# Patient Record
Sex: Female | Born: 1965 | Race: White | Hispanic: No | Marital: Married | Smoking: Never smoker
Health system: Southern US, Community
[De-identification: ages and names within clinical notes are randomized; demographics above are authoritative.]

## PROBLEM LIST (undated history)

## (undated) DIAGNOSIS — K8020 Calculus of gallbladder without cholecystitis without obstruction: Secondary | ICD-10-CM

## (undated) DIAGNOSIS — E66811 Obesity, class 1: Secondary | ICD-10-CM

## (undated) DIAGNOSIS — R010 Benign and innocent cardiac murmurs: Secondary | ICD-10-CM

## (undated) DIAGNOSIS — M722 Plantar fascial fibromatosis: Secondary | ICD-10-CM

## (undated) DIAGNOSIS — E559 Vitamin D deficiency, unspecified: Secondary | ICD-10-CM

## (undated) DIAGNOSIS — Z9109 Other allergy status, other than to drugs and biological substances: Secondary | ICD-10-CM

## (undated) DIAGNOSIS — E782 Mixed hyperlipidemia: Secondary | ICD-10-CM

## (undated) DIAGNOSIS — K219 Gastro-esophageal reflux disease without esophagitis: Secondary | ICD-10-CM

## (undated) HISTORY — DX: Gastro-esophageal reflux disease without esophagitis: K21.9

## (undated) HISTORY — DX: Other allergy status, other than to drugs and biological substances: Z91.09

---

## 1998-07-14 ENCOUNTER — Other Ambulatory Visit: Admission: RE | Admit: 1998-07-14 | Discharge: 1998-07-14 | Payer: Self-pay | Admitting: Obstetrics and Gynecology

## 1999-01-26 ENCOUNTER — Inpatient Hospital Stay (HOSPITAL_COMMUNITY)
Admission: AD | Admit: 1999-01-26 | Discharge: 1999-01-31 | Payer: Self-pay | Admitting: Physical Medicine & Rehabilitation

## 1999-03-02 ENCOUNTER — Other Ambulatory Visit: Admission: RE | Admit: 1999-03-02 | Discharge: 1999-03-02 | Payer: Self-pay | Admitting: Obstetrics and Gynecology

## 1999-10-14 ENCOUNTER — Other Ambulatory Visit: Admission: RE | Admit: 1999-10-14 | Discharge: 1999-10-14 | Payer: Self-pay | Admitting: Obstetrics and Gynecology

## 2000-05-09 ENCOUNTER — Inpatient Hospital Stay (HOSPITAL_COMMUNITY): Admission: AD | Admit: 2000-05-09 | Discharge: 2000-05-09 | Payer: Self-pay | Admitting: Obstetrics and Gynecology

## 2000-05-16 ENCOUNTER — Inpatient Hospital Stay (HOSPITAL_COMMUNITY): Admission: AD | Admit: 2000-05-16 | Discharge: 2000-05-18 | Payer: Self-pay | Admitting: Obstetrics and Gynecology

## 2000-06-27 ENCOUNTER — Other Ambulatory Visit: Admission: RE | Admit: 2000-06-27 | Discharge: 2000-06-27 | Payer: Self-pay | Admitting: Obstetrics and Gynecology

## 2001-08-29 ENCOUNTER — Other Ambulatory Visit: Admission: RE | Admit: 2001-08-29 | Discharge: 2001-08-29 | Payer: Self-pay | Admitting: Obstetrics and Gynecology

## 2002-09-24 ENCOUNTER — Other Ambulatory Visit: Admission: RE | Admit: 2002-09-24 | Discharge: 2002-09-24 | Payer: Self-pay | Admitting: Obstetrics and Gynecology

## 2003-12-16 ENCOUNTER — Other Ambulatory Visit: Admission: RE | Admit: 2003-12-16 | Discharge: 2003-12-16 | Payer: Self-pay | Admitting: Obstetrics and Gynecology

## 2005-03-03 ENCOUNTER — Other Ambulatory Visit: Admission: RE | Admit: 2005-03-03 | Discharge: 2005-03-03 | Payer: Self-pay | Admitting: Obstetrics and Gynecology

## 2005-03-07 ENCOUNTER — Emergency Department: Payer: Self-pay | Admitting: Internal Medicine

## 2006-04-22 ENCOUNTER — Ambulatory Visit: Payer: Self-pay | Admitting: Internal Medicine

## 2007-12-06 ENCOUNTER — Ambulatory Visit: Payer: Self-pay | Admitting: Internal Medicine

## 2013-10-02 ENCOUNTER — Ambulatory Visit: Payer: Self-pay

## 2013-10-12 ENCOUNTER — Ambulatory Visit: Payer: Self-pay | Admitting: Internal Medicine

## 2015-05-26 DIAGNOSIS — K080 Exfoliation of teeth due to systemic causes: Secondary | ICD-10-CM | POA: Diagnosis not present

## 2015-05-27 DIAGNOSIS — K080 Exfoliation of teeth due to systemic causes: Secondary | ICD-10-CM | POA: Diagnosis not present

## 2015-05-30 DIAGNOSIS — E559 Vitamin D deficiency, unspecified: Secondary | ICD-10-CM | POA: Diagnosis not present

## 2015-05-30 DIAGNOSIS — E781 Pure hyperglyceridemia: Secondary | ICD-10-CM | POA: Diagnosis not present

## 2015-05-30 DIAGNOSIS — E782 Mixed hyperlipidemia: Secondary | ICD-10-CM | POA: Diagnosis not present

## 2015-05-30 DIAGNOSIS — L309 Dermatitis, unspecified: Secondary | ICD-10-CM | POA: Diagnosis not present

## 2015-05-30 DIAGNOSIS — M7071 Other bursitis of hip, right hip: Secondary | ICD-10-CM | POA: Diagnosis not present

## 2015-05-30 DIAGNOSIS — Z0001 Encounter for general adult medical examination with abnormal findings: Secondary | ICD-10-CM | POA: Diagnosis not present

## 2015-07-11 DIAGNOSIS — K080 Exfoliation of teeth due to systemic causes: Secondary | ICD-10-CM | POA: Diagnosis not present

## 2015-08-08 DIAGNOSIS — Z1231 Encounter for screening mammogram for malignant neoplasm of breast: Secondary | ICD-10-CM | POA: Diagnosis not present

## 2015-08-08 DIAGNOSIS — Z01419 Encounter for gynecological examination (general) (routine) without abnormal findings: Secondary | ICD-10-CM | POA: Diagnosis not present

## 2015-08-08 DIAGNOSIS — Z6833 Body mass index (BMI) 33.0-33.9, adult: Secondary | ICD-10-CM | POA: Diagnosis not present

## 2015-09-01 DIAGNOSIS — K080 Exfoliation of teeth due to systemic causes: Secondary | ICD-10-CM | POA: Diagnosis not present

## 2015-11-27 DIAGNOSIS — K080 Exfoliation of teeth due to systemic causes: Secondary | ICD-10-CM | POA: Diagnosis not present

## 2015-12-10 DIAGNOSIS — K080 Exfoliation of teeth due to systemic causes: Secondary | ICD-10-CM | POA: Diagnosis not present

## 2015-12-18 DIAGNOSIS — K080 Exfoliation of teeth due to systemic causes: Secondary | ICD-10-CM | POA: Diagnosis not present

## 2016-04-01 DIAGNOSIS — J3089 Other allergic rhinitis: Secondary | ICD-10-CM | POA: Diagnosis not present

## 2016-04-01 DIAGNOSIS — K219 Gastro-esophageal reflux disease without esophagitis: Secondary | ICD-10-CM | POA: Diagnosis not present

## 2016-04-26 IMAGING — MR MRI OF THE RIGHT HIP WITHOUT CONTRAST
4 of 5 series · 23 of 40 positions shown · non-contrast
Comparison: 10/02/2013

CLINICAL DATA: Right hip pain since June 2013. This started after a
long hike.

EXAM:
MR OF THE RIGHT HIP WITHOUT CONTRAST
TECHNIQUE: Multiplanar, multisequence MR imaging was performed. No intravenous
contrast was administered.

[Series 2: T1 · axial · 5.0mm · 0.78mm/px · z∈[-71,+132]mm · 5 of 40 slices shown (1 of 2)]
[im 1/40]
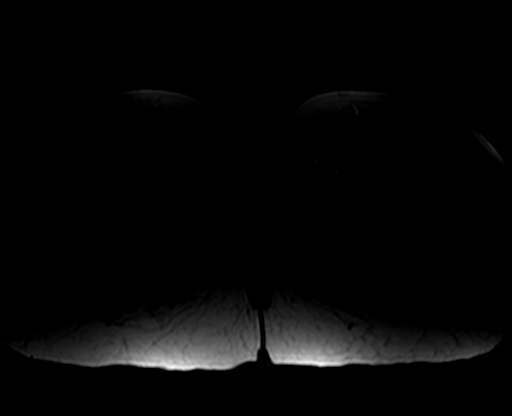
[im 5/40]
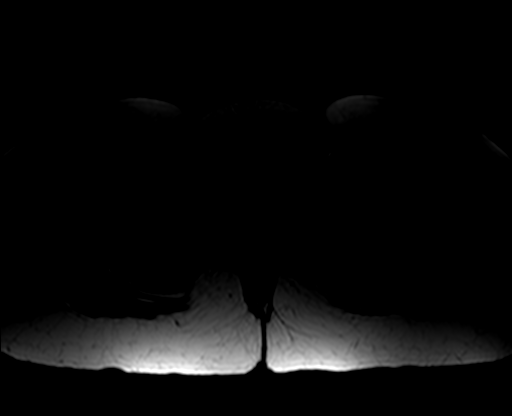
[im 10/40]
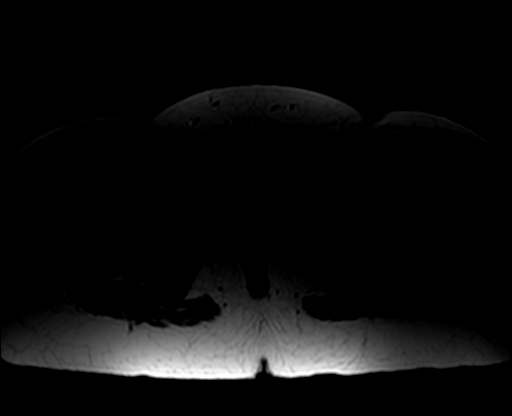
[im 20/40]
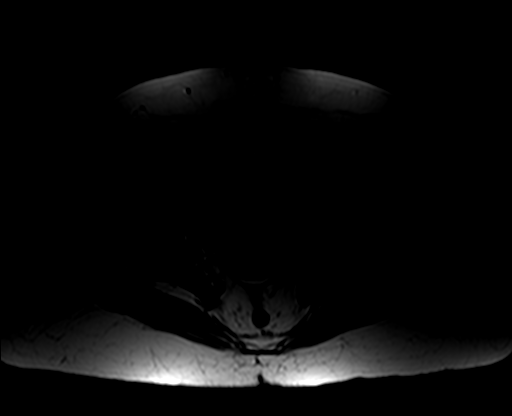
[im 35/40]
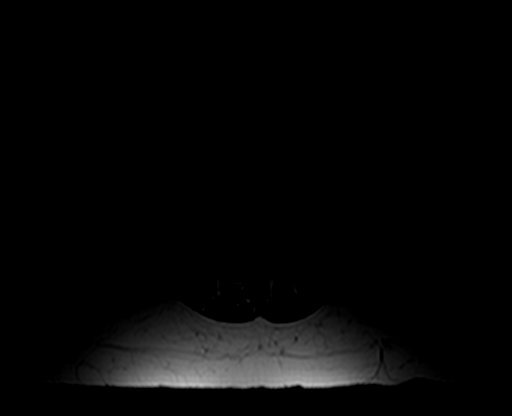

[Series 3: T2 fat-sat · axial · 5.0mm · 0.78mm/px · z∈[-73,+161]mm · 9 of 40 slices shown]
[im 1/40]
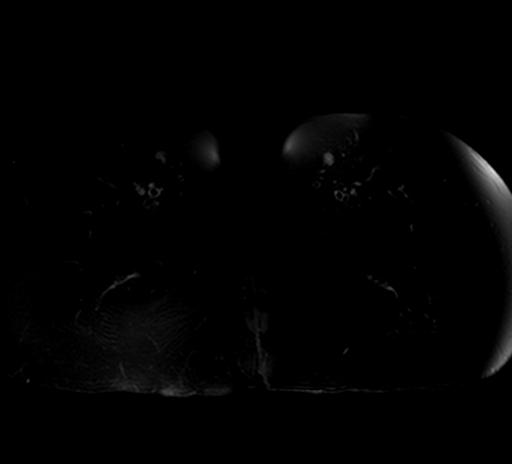
[im 5/40]
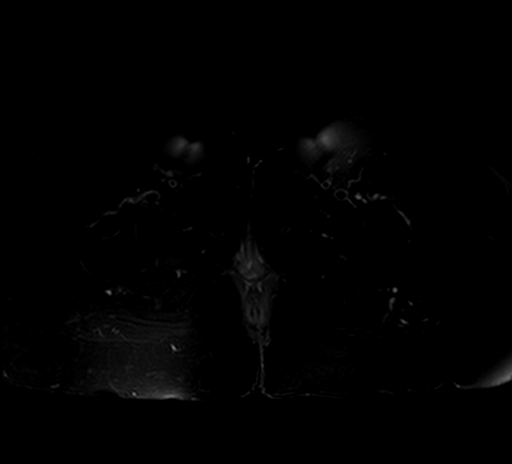
[im 10/40]
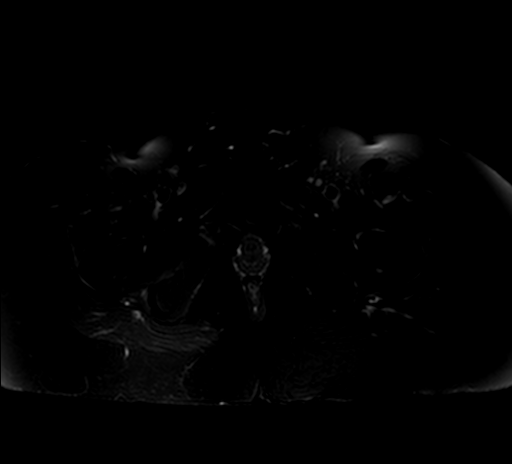
[im 15/40]
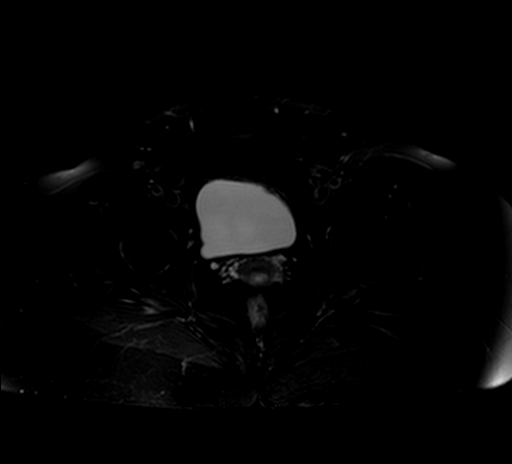
[im 20/40]
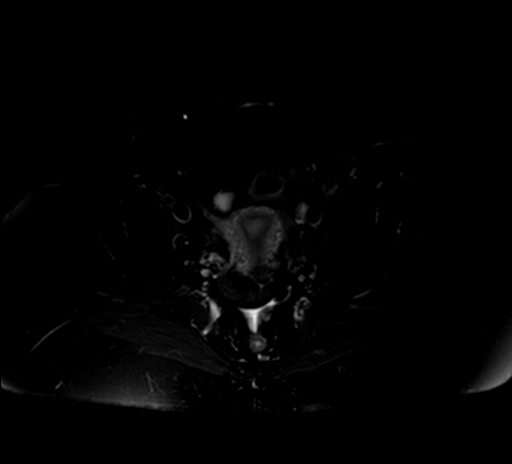
[im 25/40]
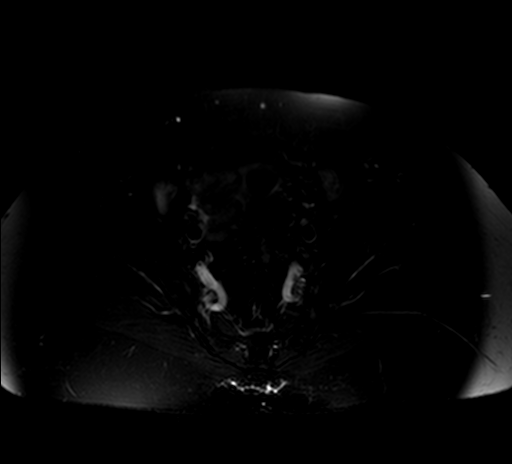
[im 30/40]
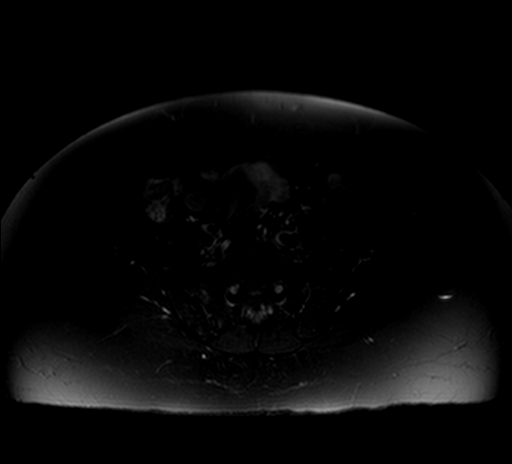
[im 35/40]
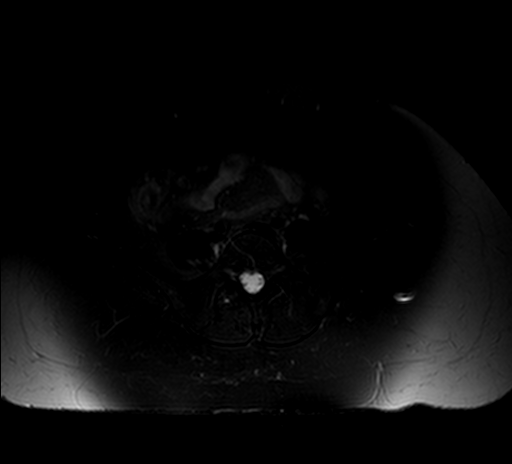
[im 40/40]
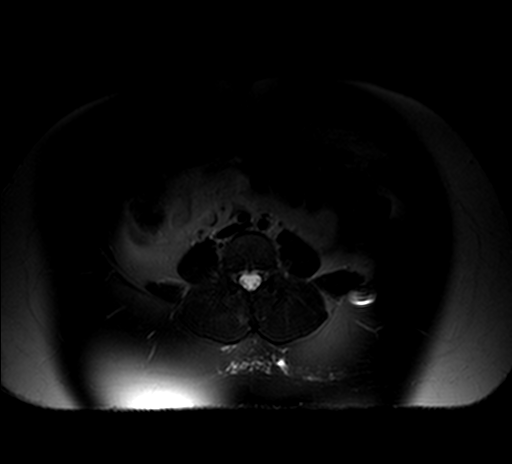

[Series 6: T1 · coronal · 4.0mm · 0.72mm/px · 3 of 35 slices shown (2 of 2)]
[im 5/35]
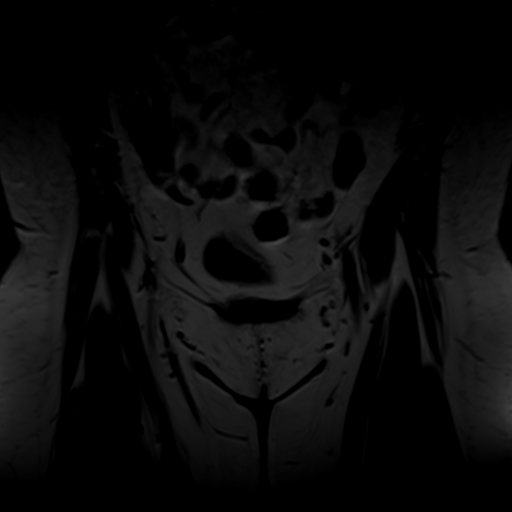
[im 20/35]
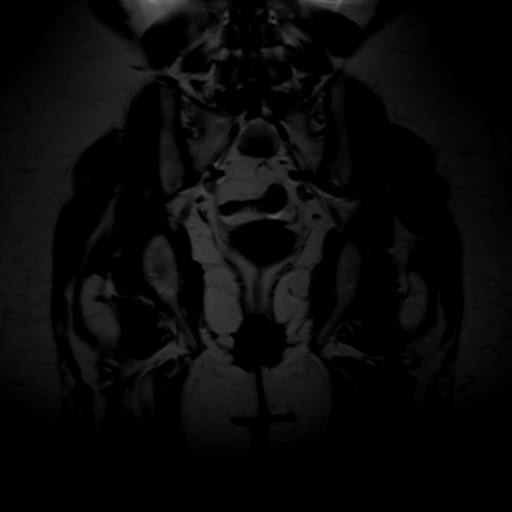
[im 30/35]
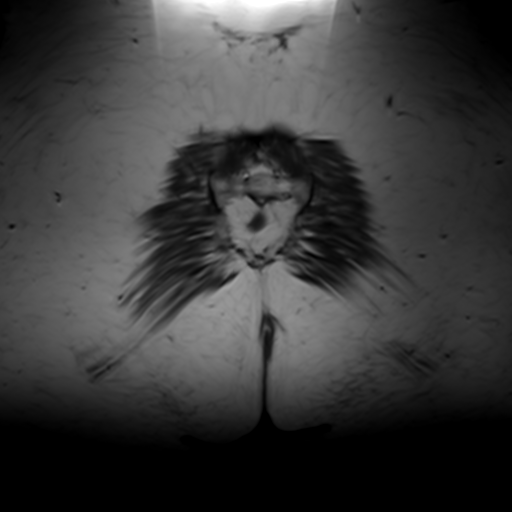

[Series 9: PD fat-sat · sagittal · 5.0mm · 0.29mm/px · 6 of 25 slices shown]
[im 1/25]
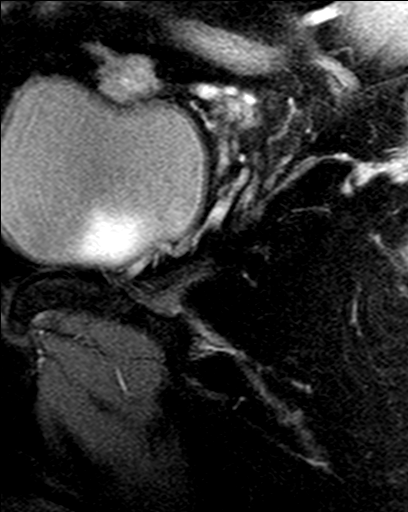
[im 5/25]
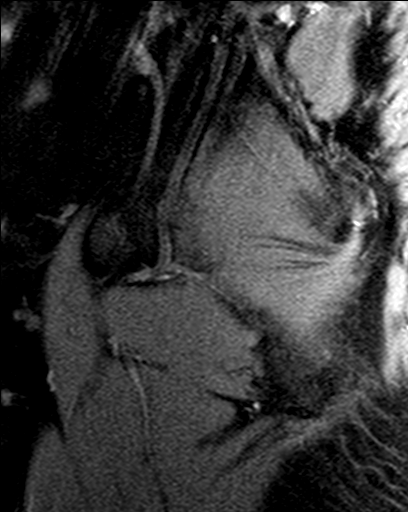
[im 10/25]
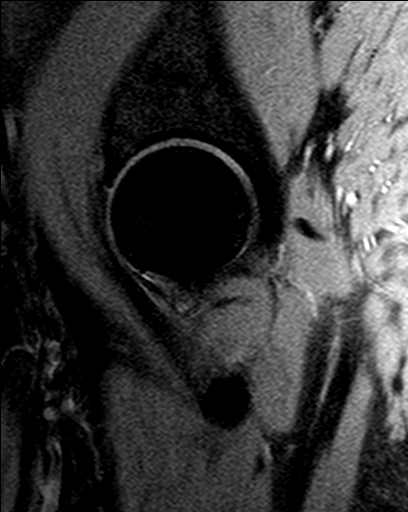
[im 15/25]
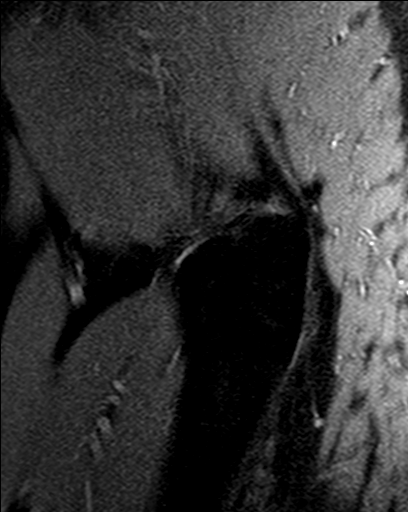
[im 20/25]
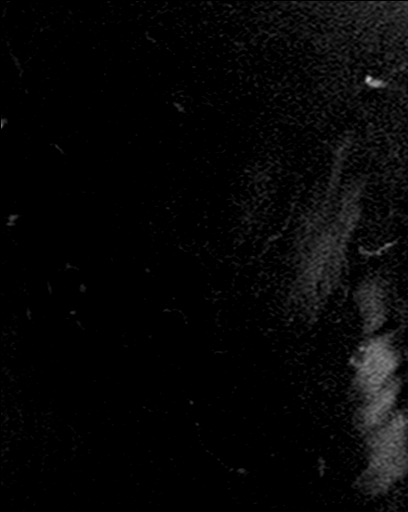
[im 25/25]
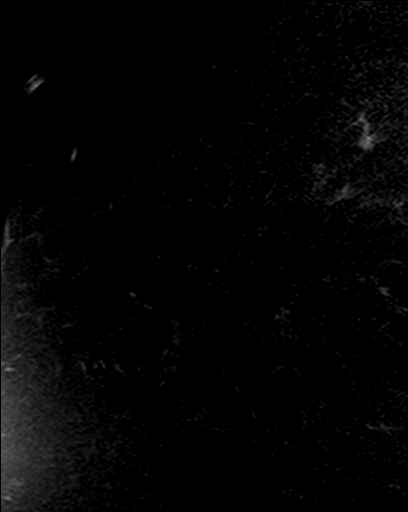

[23 of 40 positions shown; findings below may reference images not displayed]

FINDINGS: Bones: Intact

Articular cartilage and labrum

Articular cartilage:  Intact

Labrum: Grossly intact ; low negative predictive value for small
tears.

Joint or bursal effusion

Joint effusion:  Absent

Bursae:  Unremarkable

Muscles and tendons

Muscles and tendons:  Intact

Other findings

Miscellaneous:  Trace free pelvic fluid, likely physiologic.
IMPRESSION: 1. No specific internal derangement is identified to explain the
patient's persistent right hip pain.

## 2016-07-07 DIAGNOSIS — K080 Exfoliation of teeth due to systemic causes: Secondary | ICD-10-CM | POA: Diagnosis not present

## 2016-07-29 DIAGNOSIS — K219 Gastro-esophageal reflux disease without esophagitis: Secondary | ICD-10-CM | POA: Diagnosis not present

## 2016-07-29 DIAGNOSIS — M25551 Pain in right hip: Secondary | ICD-10-CM | POA: Diagnosis not present

## 2016-07-29 DIAGNOSIS — Z0001 Encounter for general adult medical examination with abnormal findings: Secondary | ICD-10-CM | POA: Diagnosis not present

## 2016-07-29 DIAGNOSIS — E782 Mixed hyperlipidemia: Secondary | ICD-10-CM | POA: Diagnosis not present

## 2016-08-26 DIAGNOSIS — Z01419 Encounter for gynecological examination (general) (routine) without abnormal findings: Secondary | ICD-10-CM | POA: Diagnosis not present

## 2016-08-26 DIAGNOSIS — Z1231 Encounter for screening mammogram for malignant neoplasm of breast: Secondary | ICD-10-CM | POA: Diagnosis not present

## 2016-08-26 DIAGNOSIS — Z6833 Body mass index (BMI) 33.0-33.9, adult: Secondary | ICD-10-CM | POA: Diagnosis not present

## 2017-01-19 DIAGNOSIS — K080 Exfoliation of teeth due to systemic causes: Secondary | ICD-10-CM | POA: Diagnosis not present

## 2017-03-10 DIAGNOSIS — K080 Exfoliation of teeth due to systemic causes: Secondary | ICD-10-CM | POA: Diagnosis not present

## 2017-04-18 DIAGNOSIS — H40003 Preglaucoma, unspecified, bilateral: Secondary | ICD-10-CM | POA: Diagnosis not present

## 2017-04-25 DIAGNOSIS — Z780 Asymptomatic menopausal state: Secondary | ICD-10-CM | POA: Diagnosis not present

## 2017-07-28 ENCOUNTER — Other Ambulatory Visit: Payer: Self-pay | Admitting: Internal Medicine

## 2017-08-01 ENCOUNTER — Ambulatory Visit: Payer: Federal, State, Local not specified - PPO | Admitting: Nurse Practitioner

## 2017-08-01 ENCOUNTER — Other Ambulatory Visit: Payer: Self-pay | Admitting: Nurse Practitioner

## 2017-08-01 ENCOUNTER — Encounter: Payer: Self-pay | Admitting: Nurse Practitioner

## 2017-08-01 VITALS — BP 114/82 | HR 60 | Resp 16 | Ht 64.0 in | Wt 199.8 lb

## 2017-08-01 DIAGNOSIS — Z0001 Encounter for general adult medical examination with abnormal findings: Secondary | ICD-10-CM

## 2017-08-01 DIAGNOSIS — R300 Dysuria: Secondary | ICD-10-CM | POA: Diagnosis not present

## 2017-08-01 DIAGNOSIS — E559 Vitamin D deficiency, unspecified: Secondary | ICD-10-CM | POA: Insufficient documentation

## 2017-08-01 DIAGNOSIS — Z7184 Encounter for health counseling related to travel: Secondary | ICD-10-CM | POA: Insufficient documentation

## 2017-08-01 DIAGNOSIS — K219 Gastro-esophageal reflux disease without esophagitis: Secondary | ICD-10-CM | POA: Insufficient documentation

## 2017-08-01 MED ORDER — OMEPRAZOLE 20 MG PO CPDR: 20.0000 mg | DELAYED_RELEASE_CAPSULE | Freq: Every day | ORAL | 11 refills | Status: DC

## 2017-08-01 NOTE — Progress Notes (Signed)
[facility] [address] [place], [place] [address]  Internal MEDICINE  Office Visit Note  Patient Name: Diana Black  [id number]  [id number]  Date of Service: 08/01/2017  No chief complaint on file.    The patient is here for health maintenance exam. She is due to have routine, fasting blood work done. Her mammograms and pap smears are done through [facility] in [place]. She had Cologuard done last year and results were negative.   Pt is here for routine health maintenance examination  Current Medication: Outpatient Encounter Medications as of 08/01/2017  Medication Sig  . omeprazole (PRILOSEC) 20 MG capsule Take 1 capsule (20 mg total) by mouth daily.  . [DISCONTINUED] omeprazole (PRILOSEC) 20 MG capsule TAKE 1 CAPSULE BY MOUTH ONCE DAILY   No facility-administered encounter medications on file as of 08/01/2017.     Surgical History: Past Surgical History:  Procedure Laterality Date  . CESAREAN SECTION      Medical History: No past medical history on file.  Family History: Family History  Problem Relation Age of Onset  . Diabetes Mother   . Bladder Cancer Mother   . Memory loss Mother   . Stroke Father       Review of Systems  Constitutional: Negative for chills, fatigue and unexpected weight change.  HENT: Negative for congestion, postnasal drip, rhinorrhea, sneezing and sore throat.   Eyes: Negative.  Negative for redness.  Respiratory: Negative for cough, chest tightness, shortness of breath and wheezing.   Cardiovascular: Negative for chest pain and palpitations.  Gastrointestinal: Negative for abdominal pain, constipation, diarrhea, nausea and vomiting.       Well managed GERD  Endocrine: Negative for cold intolerance, heat intolerance, polydipsia, polyphagia and polyuria.  Genitourinary: Negative for dysuria, flank pain, frequency, urgency and vaginal pain.  Musculoskeletal: Negative for arthralgias, back pain, joint  swelling and neck pain.  Skin: Negative for rash.  Allergic/Immunologic: Negative for environmental allergies.  Neurological: Negative for dizziness, tremors, numbness and headaches.  Hematological: Negative for adenopathy. Does not bruise/bleed easily.  Psychiatric/Behavioral: Negative for behavioral problems (Depression), dysphoric mood, sleep disturbance and suicidal ideas. The patient is not nervous/anxious.      Vital Signs: BP 114/82   Pulse 60   Resp 16   Ht 5\' 4"  (1.626 m)   Wt 199 lb 12.8 oz (90.6 kg)   SpO2 99%   BMI 34.30 kg/m    Physical Exam  Constitutional: She is oriented to person, place, and time. She appears well-developed and well-nourished. No distress.  HENT:  Head: Normocephalic and atraumatic.  Mouth/Throat: Oropharynx is clear and moist. No oropharyngeal exudate.  Eyes: Pupils are equal, round, and reactive to light. EOM are normal.  Neck: Normal range of motion. Neck supple. No JVD present. Carotid bruit is not present. No tracheal deviation present. No thyromegaly present.  Cardiovascular: Normal rate, regular rhythm, normal heart sounds and intact distal pulses. Exam reveals no gallop and no friction rub.  No murmur heard. Pulmonary/Chest: Effort normal and breath sounds normal. No respiratory distress. She has no wheezes. She has no rales. She exhibits no tenderness.  Abdominal: Soft. Bowel sounds are normal. There is no tenderness.  Musculoskeletal: Normal range of motion.  Lymphadenopathy:    She has no cervical adenopathy.  Neurological: She is alert and oriented to person, place, and time. No cranial nerve deficit.  Skin: Skin is warm and dry. Capillary refill takes less than 2 seconds. She is not diaphoretic.  Psychiatric:  She has a normal mood and affect. Her behavior is normal. Judgment and thought content normal.  Nursing note and vitals reviewed.  Assessment/Plan:  1. Encounter for health maintenance examination with abnormal  findings Annual wellness visit today. Routine, fasting labs ordered.  - Urinalysis, Routine w reflex microscopic - CBC with Differential/Platelet - Comprehensive metabolic panel - T4, free - TSH - Lipid panel  2. Gastroesophageal reflux disease without esophagitis OK to continue omeprazole 20mg  daily as needed. Refills provided today.  - omeprazole (PRILOSEC) 20 MG capsule; Take 1 capsule (20 mg total) by mouth daily.  Dispense: 30 capsule; Refill: 11  3. Vitamin D deficiency - Vitamin D 1,25 dihydroxy  4. Dysuria - Urinalysis, Routine w reflex microscopic  General Counseling: Diana Black verbalizes understanding of the findings of todays visit and agrees with plan of treatment. I have discussed any further diagnostic evaluation that may be needed or ordered today. We also reviewed her medications today. she has been encouraged to call the office with any questions or concerns that should arise related to todays visit.    Counseling:  This patient was seen by Vincent GrosHeather Essie Gehret, FNP- C in Collaboration with Dr Lyndon CodeFozia M Khan as a part of collaborative care agreement  Orders Placed This Encounter  Procedures  . Urinalysis, Routine w reflex microscopic  . CBC with Differential/Platelet  . Comprehensive metabolic panel  . T4, free  . TSH  . Lipid panel  . Vitamin D 1,25 dihydroxy    Meds ordered this encounter  Medications  . omeprazole (PRILOSEC) 20 MG capsule    Sig: Take 1 capsule (20 mg total) by mouth daily.    Dispense:  30 capsule    Refill:  11    Order Specific Question:   Supervising Provider    Answer:   Lyndon CodeKHAN, FOZIA M [[id number]]    Time spent: 4030 Minutes      Lyndon CodeFozia M Khan, MD  Internal Medicine

## 2017-08-02 LAB — COMPREHENSIVE METABOLIC PANEL
ALT: 28 IU/L (ref 0–32)
AST: 25 IU/L (ref 0–40)
Albumin/Globulin Ratio: 1.6 (ref 1.2–2.2)
Albumin: 4.1 g/dL (ref 3.5–5.5)
Alkaline Phosphatase: 80 IU/L (ref 39–117)
BUN/Creatinine Ratio: 12 (ref 9–23)
BUN: 8 mg/dL (ref 6–24)
Bilirubin Total: 0.5 mg/dL (ref 0.0–1.2)
CO2: 22 mmol/L (ref 20–29)
Calcium: 9.2 mg/dL (ref 8.7–10.2)
Chloride: 106 mmol/L (ref 96–106)
Creatinine, Ser: 0.67 mg/dL (ref 0.57–1.00)
GFR calc Af Amer: 118 mL/min/{1.73_m2} (ref >59)
GFR calc non Af Amer: 102 mL/min/{1.73_m2} (ref >59)
Globulin, Total: 2.5 g/dL (ref 1.5–4.5)
Glucose: 80 mg/dL (ref 65–99)
Potassium: 4.1 mmol/L (ref 3.5–5.2)
Sodium: 136 mmol/L (ref 134–144)
Total Protein: 6.6 g/dL (ref 6.0–8.5)

## 2017-08-02 LAB — CBC
Hematocrit: 41.6 % (ref 34.0–46.6)
Hemoglobin: 14.2 g/dL (ref 11.1–15.9)
MCH: 30.7 pg (ref 26.6–33.0)
MCHC: 34.1 g/dL (ref 31.5–35.7)
MCV: 90 fL (ref 79–97)
Platelets: 275 10*3/uL (ref 150–450)
RBC: 4.63 x10E6/uL (ref 3.77–5.28)
RDW: 13.3 % (ref 12.3–15.4)
WBC: 7.3 10*3/uL (ref 3.4–10.8)

## 2017-08-02 LAB — URINALYSIS, ROUTINE W REFLEX MICROSCOPIC
Bilirubin, UA: NEGATIVE
Glucose, UA: NEGATIVE
Ketones, UA: NEGATIVE
Nitrite, UA: NEGATIVE
Protein, UA: NEGATIVE
RBC, UA: NEGATIVE
Specific Gravity, UA: <=1.005 — AB (ref 1.005–1.030)
Urobilinogen, Ur: 0.2 mg/dL (ref 0.2–1.0)
pH, UA: 7 (ref 5.0–7.5)

## 2017-08-02 LAB — MICROSCOPIC EXAMINATION: Casts: NONE SEEN /lpf

## 2017-08-02 LAB — LIPID PANEL W/O CHOL/HDL RATIO
Cholesterol, Total: 187 mg/dL (ref 100–199)
HDL: 55 mg/dL (ref >39)
LDL Calculated: 114 mg/dL — ABNORMAL HIGH (ref 0–99)
Triglycerides: 88 mg/dL (ref 0–149)
VLDL Cholesterol Cal: 18 mg/dL (ref 5–40)

## 2017-08-02 LAB — VITAMIN D 25 HYDROXY (VIT D DEFICIENCY, FRACTURES): Vit D, 25-Hydroxy: 20.2 ng/mL — ABNORMAL LOW (ref 30.0–100.0)

## 2017-08-02 LAB — T4, FREE: Free T4: 1.06 ng/dL (ref 0.82–1.77)

## 2017-08-02 LAB — TSH: TSH: 1.84 u[IU]/mL (ref 0.450–4.500)

## 2017-08-30 ENCOUNTER — Other Ambulatory Visit: Payer: Self-pay | Admitting: Internal Medicine

## 2017-09-07 DIAGNOSIS — Z1231 Encounter for screening mammogram for malignant neoplasm of breast: Secondary | ICD-10-CM | POA: Diagnosis not present

## 2017-09-07 DIAGNOSIS — N760 Acute vaginitis: Secondary | ICD-10-CM | POA: Diagnosis not present

## 2017-09-07 DIAGNOSIS — Z6834 Body mass index (BMI) 34.0-34.9, adult: Secondary | ICD-10-CM | POA: Diagnosis not present

## 2017-09-07 DIAGNOSIS — Z01419 Encounter for gynecological examination (general) (routine) without abnormal findings: Secondary | ICD-10-CM | POA: Diagnosis not present

## 2017-09-19 DIAGNOSIS — K080 Exfoliation of teeth due to systemic causes: Secondary | ICD-10-CM | POA: Diagnosis not present

## 2017-10-13 ENCOUNTER — Encounter: Payer: Self-pay | Admitting: Adult Health

## 2017-10-13 ENCOUNTER — Ambulatory Visit: Payer: Federal, State, Local not specified - PPO | Admitting: Adult Health

## 2017-10-13 VITALS — BP 112/78 | HR 68 | Temp 98.0°F | Resp 16 | Ht 64.0 in | Wt 197.6 lb

## 2017-10-13 DIAGNOSIS — B029 Zoster without complications: Secondary | ICD-10-CM

## 2017-10-13 DIAGNOSIS — R1031 Right lower quadrant pain: Secondary | ICD-10-CM

## 2017-10-13 MED ORDER — VALACYCLOVIR HCL 1 G PO TABS: 1000.0000 mg | ORAL_TABLET | Freq: Three times a day (TID) | ORAL | 0 refills | Status: DC

## 2017-10-13 NOTE — Patient Instructions (Signed)

## 2017-10-13 NOTE — Progress Notes (Signed)
[facility] [address] [place], [place] [address]  Internal MEDICINE  Office Visit Note  Patient Name: Diana Black  [id number]  [id number]  Date of Service: 10/24/2017  Chief Complaint  Patient presents with  . Rash    lower back , have a pulling sensation in groin area , spots on legs , was treated by obgyn for a bacteria infection july 31 was put on a vaginal sup for 3 days.      HPI Pt is here for a sick visit. Pt reports she stayed in a motel this weekend while visiting her daughter for a college football game. She reports a rash that came up on her lower back. She reports a burning and itching painful rash on her right lower back.  She also reports right groin "pulling" sensation.  She reports she did have a few days of fatigue, but denies fever, chills or body aches.    Current Medication:  Outpatient Encounter Medications as of 10/13/2017  Medication Sig  . fluticasone (FLONASE) 50 MCG/ACT nasal spray Place into both nostrils daily.  Marland Kitchen omeprazole (PRILOSEC) 20 MG capsule Take 1 capsule (20 mg total) by mouth daily.  . valACYclovir (VALTREX) 1000 MG tablet Take 1 tablet (1,000 mg total) by mouth 3 (three) times daily.  . [DISCONTINUED] omeprazole (PRILOSEC) 20 MG capsule TAKE 1 CAPSULE BY MOUTH ONCE DAILY   No facility-administered encounter medications on file as of 10/13/2017.     Medical History: Past Medical History:  Diagnosis Date  . Environmental allergies   . GERD (gastroesophageal reflux disease)    Vital Signs: BP 112/78   Pulse 68   Temp 98 F (36.7 C)   Resp 16   Ht 5\' 4"  (1.626 m)   Wt 197 lb 9.6 oz (89.6 kg)   SpO2 99%   BMI 33.92 kg/m    Review of Systems  Constitutional: Negative for chills, fatigue and unexpected weight change.  HENT: Negative for congestion, rhinorrhea, sneezing and sore throat.   Eyes: Negative for photophobia, pain and redness.  Respiratory: Negative for cough, chest tightness and shortness of breath.    Cardiovascular: Negative for chest pain and palpitations.  Gastrointestinal: Negative for abdominal pain, constipation, diarrhea, nausea and vomiting.  Endocrine: Negative.   Genitourinary: Negative for dysuria and frequency.  Musculoskeletal: Negative for arthralgias, back pain, joint swelling and neck pain.  Skin: Positive for rash.       Rash to right lower back.  Allergic/Immunologic: Negative.   Neurological: Negative for tremors and numbness.  Hematological: Negative for adenopathy. Does not bruise/bleed easily.  Psychiatric/Behavioral: Negative for behavioral problems and sleep disturbance. The patient is not nervous/anxious.     Physical Exam  Constitutional: She is oriented to person, place, and time. She appears well-developed and well-nourished. No distress.  HENT:  Head: Normocephalic and atraumatic.  Mouth/Throat: Oropharynx is clear and moist. No oropharyngeal exudate.  Eyes: Pupils are equal, round, and reactive to light. EOM are normal.  Neck: Normal range of motion. Neck supple. No JVD present. No tracheal deviation present. No thyromegaly present.  Cardiovascular: Normal rate, regular rhythm and normal heart sounds. Exam reveals no gallop and no friction rub.  No murmur heard. Pulmonary/Chest: Effort normal and breath sounds normal. No respiratory distress. She has no wheezes. She has no rales. She exhibits no tenderness.  Abdominal: Soft. There is no tenderness. There is no guarding.  Musculoskeletal: Normal range of motion.  Lymphadenopathy:    She has no cervical  adenopathy.  Neurological: She is alert and oriented to person, place, and time. No cranial nerve deficit.  Skin: Skin is warm and dry. She is not diaphoretic.  Psychiatric: She has a normal mood and affect. Her behavior is normal. Judgment and thought content normal.  Nursing note and vitals reviewed.   Assessment/Plan: 1. 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   Current outbreak appears to be healing with no difficulty.  Pt currently denies pain.  She will return if rash flares up again, and does not get relief with valtrex.   2. Groin discomfort, right Use ice/heat for 20 minutes on, 2 hours off.  May take Ibuprofen or other NSAID.  If symptoms do not improve in 1 week, return to clinic.   General Counseling: daron breeding understanding of the findings of todays visit and agrees with plan of treatment. I have discussed any further diagnostic evaluation that may be needed or ordered today. We also reviewed her medications today. she has been encouraged to call the office with any questions or concerns that should arise related to todays visit.   No orders of the defined types were placed in this encounter.   Meds ordered this encounter  Medications  . valACYclovir (VALTREX) 1000 MG tablet    Sig: Take 1 tablet (1,000 mg total) by mouth 3 (three) times daily.    Dispense:  20 tablet    Refill:  0    Time spent: 25 Minutes  This patient was seen by Blima Ledger AGNP-C in Collaboration with Dr Lyndon Code as a part of collaborative care agreement

## 2017-10-24 ENCOUNTER — Encounter: Payer: Self-pay | Admitting: Adult Health

## 2017-10-25 ENCOUNTER — Telehealth: Payer: Self-pay

## 2017-10-25 NOTE — Telephone Encounter (Signed)
Called pt to check on her with her rash per DFk, pt didn't answer, left a voicemail for her to call me back

## 2017-10-25 NOTE — Telephone Encounter (Signed)
-----   Message from Lyndon CodeFozia M Khan, MD sent at 10/24/2017  7:11 PM EDT ----- Can we find out how is she feeling with her rash

## 2018-03-17 ENCOUNTER — Ambulatory Visit: Payer: Federal, State, Local not specified - PPO | Admitting: Nurse Practitioner

## 2018-03-17 ENCOUNTER — Encounter: Payer: Self-pay | Admitting: Nurse Practitioner

## 2018-03-17 VITALS — BP 116/73 | HR 71 | Resp 16 | Ht 64.0 in | Wt 192.8 lb

## 2018-03-17 DIAGNOSIS — M25559 Pain in unspecified hip: Secondary | ICD-10-CM | POA: Insufficient documentation

## 2018-03-17 DIAGNOSIS — S93504A Unspecified sprain of right lesser toe(s), initial encounter: Secondary | ICD-10-CM | POA: Diagnosis not present

## 2018-03-17 DIAGNOSIS — M7060 Trochanteric bursitis, unspecified hip: Secondary | ICD-10-CM | POA: Insufficient documentation

## 2018-03-17 NOTE — Progress Notes (Signed)
[facility] [address] [place], [place] [address]  Internal MEDICINE  Office Visit Note  Patient Name: Diana Black  [id number]  [id number]  Date of Service: 03/22/2018   Pt is here for a sick visit.   Chief Complaint  Patient presents with  . Pain    possible broken right pinky toe, red and swollen, happened about 3.5 wks ago     The patient states that she caught her little toe of right foot on a laundry basket about 3.5 weeks ago. Bet in backward. Got swollen and appeared bruised. Range of motion of the toe is slightly diminished due to the pain and swelling. She is getting ready to go on trip [place] and was worrying about going if there was something more significant than sprain or fracture going on. She is able to walk, however causes some increased pain.      Current Medication:  Outpatient Encounter Medications as of 03/17/2018  Medication Sig  . omeprazole (PRILOSEC) 20 MG capsule Take 1 capsule (20 mg total) by mouth daily.  . fluticasone (FLONASE) 50 MCG/ACT nasal spray Place into both nostrils daily.  . valACYclovir (VALTREX) 1000 MG tablet Take 1 tablet (1,000 mg total) by mouth 3 (three) times daily. (Patient not taking: Reported on 03/17/2018)   No facility-administered encounter medications on file as of 03/17/2018.       Medical History: Past Medical History:  Diagnosis Date  . Environmental allergies   . GERD (gastroesophageal reflux disease)      Today's Vitals   03/17/18 1406  BP: 116/73  Pulse: 71  Resp: 16  SpO2: 100%  Weight: 192 lb 12.8 oz (87.5 kg)  Height: 5\' 4"  (1.626 m)    Review of Systems  Constitutional: Negative for activity change, chills, fatigue and unexpected weight change.  HENT: Negative for congestion, postnasal drip, rhinorrhea, sneezing and sore throat.   Eyes: Negative for redness.  Respiratory: Negative for cough, chest tightness and shortness of breath.   Cardiovascular: Negative for chest pain  and palpitations.  Musculoskeletal: Negative for arthralgias, back pain, joint swelling and neck pain.       Right foot pain, mostly 5th toe and just proximal to the toe. Bruised and slightly swollen. Able to walk without too much difficulty.   Skin: Negative for rash.  Neurological: Negative.  Negative for tremors and numbness.  Hematological: Negative for adenopathy. Does not bruise/bleed easily.  Psychiatric/Behavioral: Negative for behavioral problems (Depression), sleep disturbance and suicidal ideas. The patient is not nervous/anxious.     Physical Exam Vitals signs and nursing note reviewed.  Constitutional:      General: She is not in acute distress.    Appearance: Normal appearance. She is well-developed. She is not diaphoretic.  HENT:     Head: Normocephalic and atraumatic.     Mouth/Throat:     Pharynx: No oropharyngeal exudate.  Eyes:     Pupils: Pupils are equal, round, and reactive to light.  Neck:     Musculoskeletal: Normal range of motion.     Thyroid: No thyromegaly.     Vascular: No JVD.     Trachea: No tracheal deviation.  Cardiovascular:     Rate and Rhythm: Normal rate and regular rhythm.     Heart sounds: Normal heart sounds. No murmur. No friction rub. No gallop.   Pulmonary:     Effort: Pulmonary effort is normal. No respiratory distress.     Breath sounds: Normal breath sounds. No wheezing  or rales.  Chest:     Chest wall: No tenderness.  Musculoskeletal: Normal range of motion.     Comments: Minimal swelling with mild bruising present at 5th metacarpal of right foot. ROM slightly limited due to pain. There is tenderness in the foot just proximal of the 5th to without swelling or bruising. Capillary refill and pulses of right foot intact. Patient able to walk without limp. No crepitus felt with palpation of the toe and foot.   Lymphadenopathy:     Cervical: No cervical adenopathy.  Skin:    General: Skin is warm and dry.  Neurological:     Mental  Status: She is alert and oriented to person, place, and time.     Cranial Nerves: No cranial nerve deficit.  Psychiatric:        Behavior: Behavior normal.        Thought Content: Thought content normal.        Judgment: Judgment normal.   Assessment/Plan: 1. Sprain of fifth toe of right foot, initial encounter advised patient to rest, ice, and elevate the right foot. Take NSAIDs and/or tylenol as needed and as indicated. Consider "buddy taping" to 4th toe for support. Will get imaging if no improvement over next 2 to 3 weeks.   General Counseling: [name] verbalizes understanding of the findings of todays visit and agrees with plan of treatment. I have discussed any further diagnostic evaluation that may be needed or ordered today. We also reviewed her medications today. she has been encouraged to call the office with any questions or concerns that should arise related to todays visit.    Counseling:  Apply a compressive ACE bandage. Rest and elevate the affected painful area.  Apply cold compresses intermittently as needed.  As pain recedes, begin normal activities slowly as tolerated.  Call if symptoms persist.  This patient was seen by Vincent GrosHeather Jeyda Siebel FNP Collaboration with Dr Lyndon CodeFozia M Khan as a part of collaborative care agreement  Time spent: 15 Minutes

## 2018-03-22 DIAGNOSIS — S93504A Unspecified sprain of right lesser toe(s), initial encounter: Secondary | ICD-10-CM | POA: Insufficient documentation

## 2018-03-27 ENCOUNTER — Ambulatory Visit: Payer: Federal, State, Local not specified - PPO | Admitting: Adult Health

## 2018-03-27 ENCOUNTER — Other Ambulatory Visit: Payer: Self-pay

## 2018-03-27 ENCOUNTER — Encounter: Payer: Self-pay | Admitting: Adult Health

## 2018-03-27 VITALS — BP 130/72 | HR 84 | Resp 16 | Ht 64.0 in | Wt 193.0 lb

## 2018-03-27 DIAGNOSIS — Z7189 Other specified counseling: Secondary | ICD-10-CM

## 2018-03-27 DIAGNOSIS — Z7185 Encounter for immunization safety counseling: Secondary | ICD-10-CM

## 2018-03-27 NOTE — Progress Notes (Signed)
[facility] [address] [place], [place] [address]  Internal MEDICINE  Office Visit Note  Patient Name: Diana Black  [id number]  [id number]  Date of Service: 06/13/2018  Chief Complaint  Patient presents with  . Medical Management of Chronic Issues    needing labs , going to [place] , need proof of immuniztions and new ones     HPI  Pt is planning trip to [place] this winter.  Her husband will be there for 14 months, and they plan to visit for 2-3 weeks.    Current Medication: Outpatient Encounter Medications as of 03/27/2018  Medication Sig  . fluticasone (FLONASE) 50 MCG/ACT nasal spray Place into both nostrils daily.  . [DISCONTINUED] omeprazole (PRILOSEC) 20 MG capsule Take 1 capsule (20 mg total) by mouth daily.  . valACYclovir (VALTREX) 1000 MG tablet Take 1 tablet (1,000 mg total) by mouth 3 (three) times daily.   No facility-administered encounter medications on file as of 03/27/2018.     Surgical History: Past Surgical History:  Procedure Laterality Date  . CESAREAN SECTION      Medical History: Past Medical History:  Diagnosis Date  . Environmental allergies   . GERD (gastroesophageal reflux disease)     Family History: Family History  Problem Relation Age of Onset  . Diabetes Mother   . Bladder Cancer Mother   . Memory loss Mother   . Stroke Father     Social History   Socioeconomic History  . Marital status: Married    Spouse name: Not on file  . Number of children: Not on file  . Years of education: Not on file  . Highest education level: Not on file  Occupational History  . Not on file  Social Needs  . Financial resource strain: Not on file  . Food insecurity:    Worry: Not on file    Inability: Not on file  . Transportation needs:    Medical: Not on file    Non-medical: Not on file  Tobacco Use  . Smoking status: Never Smoker  . Smokeless tobacco: Never Used  Substance and Sexual Activity  . Alcohol use: Not  Currently  . Drug use: Never  . Sexual activity: Yes  Lifestyle  . Physical activity:    Days per week: Not on file    Minutes per session: Not on file  . Stress: Not on file  Relationships  . Social connections:    Talks on phone: Not on file    Gets together: Not on file    Attends religious service: Not on file    Active member of club or organization: Not on file    Attends meetings of clubs or organizations: Not on file    Relationship status: Not on file  . Intimate partner violence:    Fear of current or ex partner: Not on file    Emotionally abused: Not on file    Physically abused: Not on file    Forced sexual activity: Not on file  Other Topics Concern  . Not on file  Social History Narrative  . Not on file      Review of Systems  Constitutional: Negative for chills, fatigue and unexpected weight change.  HENT: Negative for congestion, rhinorrhea, sneezing and sore throat.   Eyes: Negative for photophobia, pain and redness.  Respiratory: Negative for cough, chest tightness and shortness of breath.   Cardiovascular: Negative for chest pain and palpitations.  Gastrointestinal: Negative for abdominal pain,  constipation, diarrhea, nausea and vomiting.  Endocrine: Negative.   Genitourinary: Negative for dysuria and frequency.  Musculoskeletal: Negative for arthralgias, back pain, joint swelling and neck pain.  Skin: Negative for rash.  Allergic/Immunologic: Negative.   Neurological: Negative for tremors and numbness.  Hematological: Negative for adenopathy. Does not bruise/bleed easily.  Psychiatric/Behavioral: Negative for behavioral problems and sleep disturbance. The patient is not nervous/anxious.     Vital Signs: BP 130/72   Pulse 84   Resp 16   Ht 5\' 4"  (1.626 m)   Wt 193 lb (87.5 kg)   SpO2 98%   BMI 33.13 kg/m    Physical Exam Vitals signs and nursing note reviewed.  Constitutional:      General: She is not in acute distress.    Appearance:  She is well-developed. She is not diaphoretic.  HENT:     Head: Normocephalic and atraumatic.     Mouth/Throat:     Pharynx: No oropharyngeal exudate.  Eyes:     Pupils: Pupils are equal, round, and reactive to light.  Neck:     Musculoskeletal: Normal range of motion and neck supple.     Thyroid: No thyromegaly.     Vascular: No JVD.     Trachea: No tracheal deviation.  Cardiovascular:     Rate and Rhythm: Normal rate and regular rhythm.     Heart sounds: Normal heart sounds. No murmur. No friction rub. No gallop.   Pulmonary:     Effort: Pulmonary effort is normal. No respiratory distress.     Breath sounds: Normal breath sounds. No wheezing or rales.  Chest:     Chest wall: No tenderness.  Abdominal:     Palpations: Abdomen is soft.     Tenderness: There is no abdominal tenderness. There is no guarding.  Musculoskeletal: Normal range of motion.  Lymphadenopathy:     Cervical: No cervical adenopathy.  Skin:    General: Skin is warm and dry.  Neurological:     Mental Status: She is alert and oriented to person, place, and time.     Cranial Nerves: No cranial nerve deficit.  Psychiatric:        Behavior: Behavior normal.        Thought Content: Thought content normal.        Judgment: Judgment normal.    Assessment/Plan: 1. Encounter for counseling regarding immunization Titers sent to lab for patient to have drawn.  Discussed health departments role in getting immunizations for travel, or if titers are negative. - Hep B Core Ab W/Reflex - Varicella zoster antibody, IgG - Measles/Mumps/Rubella Immunity  General Counseling: Kiel verbalizes understanding of the findings of todays visit and agrees with plan of treatment. I have discussed any further diagnostic evaluation that may be needed or ordered today. We also reviewed her medications today. she has been encouraged to call the office with any questions or concerns that should arise related to todays  visit.    Orders Placed This Encounter  Procedures  . Hep B Core Ab W/Reflex  . Varicella zoster antibody, IgG  . Measles/Mumps/Rubella Immunity    No orders of the defined types were placed in this encounter.   Time spent: 25 Minutes   This patient was seen by Blima Ledger AGNP-C in Collaboration with Dr Lyndon Code as a part of collaborative care agreement     Johnna Acosta AGNP-C Internal medicine

## 2018-03-27 NOTE — Patient Instructions (Signed)
Hepatitis B Antigen and Antibody Tests  Why am I having this test?  These tests may be done:  · To diagnose hepatitis B infection.  · To check for infection, if there is concern that you have been exposed to hepatitis B.  · To find the cause of long-term (chronic) liver disease or abnormal liver function test results.  · To see if you are already protected from (immune to) hepatitis B.  · To see if you need the hepatitis B vaccine to protect you from future infection.  What is being tested?  Testing for hepatitis B virus (HBV) may be done by checking your blood for the presence of the virus itself (antigen) or for the presence of infection-fighting proteins (antibodies). Hepatitis B antibodies are produced by your body's disease-fighting system (immune system) in response to an HBV infection. The antibodies are also produced after you have gotten the hepatitis B vaccine.  Different types of HBV antibodies may be present in your blood, depending on how long it has been since infection or vaccination occurred. Different types of HBV antigens can also be detected. The hepatitis B antigen and antibody tests detect the presence of these antigens or antibodies.  What kind of sample is taken?    A blood sample is required for the hepatitis B antigen and antibody tests. It is usually collected by inserting a needle into a blood vessel.  How are the results reported?  Your test results will be reported as either positive or negative for the types of hepatitis B antigens or antibodies tested for.  What do the results mean?  The test results are based on which antibodies your blood has (is positive for) and which antibodies your blood does not have (is negative for). The test results are also based on whether hepatitis B antigen molecules are found in your blood.  · Sometimes, the test results may report that a condition is present when it is not present (false-positive result).  · Sometimes, the test results may report that a  condition is not present when it is present (false-negative result).  Negative results  Results that are negative for hepatitis B antigens may mean:  · You do not have a current or active hepatitis B infection.  Results that are negative for hepatitis B antibodies may mean:  · You are not immune to HBV from past exposure or from vaccines.  Positive results  Results that are positive for hepatitis B antigens may mean:  · You have a current, active, or long-term (chronic) hepatitis B infection.  Results that are positive for hepatitis B antibodies may mean:  · You are immune to HBV from past exposure or from vaccines.  Talk with your health care provider about what your results mean.  Questions to ask your health care provider  Ask your health care provider, or the department that is doing the test:  · When will my results be ready?  · How will I get my results?  · What are my treatment options?  · What other tests do I need?  · What are my next steps?  Summary  · Testing for hepatitis B virus (HBV) may be done by checking your blood for the presence of the virus itself (antigen) or for the presence of infection-fighting proteins (antibodies).  · Hepatitis B antibodies are produced by your body's disease-fighting system (immune system) in response to an infection with HBV. These antibodies are also produced after you have gotten the hepatitis   B vaccine.  · A blood sample is required for the hepatitis B antigen and antibody tests. It is usually collected by inserting a needle into a blood vessel.  · The test results are based on which antibodies your blood has (is positive for) and which antibodies your blood does not have (is negative for). The test results are also based on whether hepatitis B antigens are found in your blood.  This information is not intended to replace advice given to you by your health care provider. Make sure you discuss any questions you have with your health care provider.  Document Released:  02/20/2004 Document Revised: 01/06/2017 Document Reviewed: 01/06/2017  Elsevier Interactive Patient Education © 2019 [employer]

## 2018-03-28 ENCOUNTER — Telehealth: Payer: Self-pay

## 2018-03-28 ENCOUNTER — Telehealth: Payer: Self-pay | Admitting: Adult Health

## 2018-03-28 LAB — MEASLES/MUMPS/RUBELLA IMMUNITY
MUMPS ABS, IGG: >300 AU/mL (ref >10.9)
RUBEOLA AB, IGG: >300 AU/mL (ref >16.4)
Rubella Antibodies, IGG: 32 index (ref >0.99)

## 2018-03-28 LAB — VARICELLA ZOSTER ANTIBODY, IGG: Varicella zoster IgG: >4000 index (ref >165)

## 2018-03-28 LAB — HEPATITIS B CORE AB W/REFLEX: Hep B Core Total Ab: NEGATIVE

## 2018-03-28 NOTE — Telephone Encounter (Signed)
Left message to return call in regards to labs

## 2018-03-28 NOTE — Telephone Encounter (Signed)
Pt advised for labs see reminder and copy of labs for pickup/np

## 2018-03-28 NOTE — Telephone Encounter (Signed)
-----  Message from Kendell Bane, NP sent at 03/28/2018 12:58 PM EST ----- Pts titers came back.  She shows immunity to Chicken pox, and MMR...Marland KitchenMarland KitchenShe does NOT have immunity to Hep B.  She needs to know this info because she is going to there health department for immunizations.  Please call and let her know.  Thank you.

## 2018-04-04 ENCOUNTER — Ambulatory Visit: Payer: Federal, State, Local not specified - PPO | Admitting: Adult Health

## 2018-04-04 ENCOUNTER — Encounter: Payer: Self-pay | Admitting: Adult Health

## 2018-04-04 VITALS — BP 131/77 | HR 78 | Resp 16 | Ht 64.0 in | Wt 191.0 lb

## 2018-04-04 DIAGNOSIS — J0110 Acute frontal sinusitis, unspecified: Secondary | ICD-10-CM | POA: Diagnosis not present

## 2018-04-04 DIAGNOSIS — R05 Cough: Secondary | ICD-10-CM

## 2018-04-04 DIAGNOSIS — R059 Cough, unspecified: Secondary | ICD-10-CM

## 2018-04-04 MED ORDER — SULFAMETHOXAZOLE-TRIMETHOPRIM 800-160 MG PO TABS: 1.0000 | ORAL_TABLET | Freq: Two times a day (BID) | ORAL | 0 refills | Status: DC

## 2018-04-04 MED ORDER — GUAIFENESIN-CODEINE 100-10 MG/5ML PO SYRP: 5.0000 mL | ORAL_SOLUTION | Freq: Every evening | ORAL | 0 refills | Status: DC | PRN

## 2018-04-04 NOTE — Patient Instructions (Signed)

## 2018-04-04 NOTE — Progress Notes (Signed)
[facility] [address] [place], [place] [address]  Internal MEDICINE  Office Visit Note  Patient Name: Diana Black  [id number]  [id number]  Date of Service: 04/04/2018  Chief Complaint  Patient presents with  . Cough    chest congestion its been about 2 3/12 weeks .    HPI  Pt here reporting sinus pressure and drainage that hs been persistent for over a week.  She reports she has begun coughing and having chills.  She has not been able to take her temperature.  She report she has vomited once, due to the sinus drainage. She denies any nausea or diarrhea.     Current Medication: Outpatient Encounter Medications as of 04/04/2018  Medication Sig  . fluticasone (FLONASE) 50 MCG/ACT nasal spray Place into both nostrils daily.  Marland Kitchen omeprazole (PRILOSEC) 20 MG capsule Take 1 capsule (20 mg total) by mouth daily.  . valACYclovir (VALTREX) 1000 MG tablet Take 1 tablet (1,000 mg total) by mouth 3 (three) times daily.  Marland Kitchen guaiFENesin-codeine (ROBITUSSIN AC) 100-10 MG/5ML syrup Take 5 mLs by mouth at bedtime as needed for cough.  . sulfamethoxazole-trimethoprim (BACTRIM DS,SEPTRA DS) 800-160 MG tablet Take 1 tablet by mouth 2 (two) times daily.   No facility-administered encounter medications on file as of 04/04/2018.     Surgical History: Past Surgical History:  Procedure Laterality Date  . CESAREAN SECTION      Medical History: Past Medical History:  Diagnosis Date  . Environmental allergies   . GERD (gastroesophageal reflux disease)     Family History: Family History  Problem Relation Age of Onset  . Diabetes Mother   . Bladder Cancer Mother   . Memory loss Mother   . Stroke Father     Social History   Socioeconomic History  . Marital status: Married    Spouse name: Not on file  . Number of children: Not on file  . Years of education: Not on file  . Highest education level: Not on file  Occupational History  . Not on file  Social Needs  .  Financial resource strain: Not on file  . Food insecurity:    Worry: Not on file    Inability: Not on file  . Transportation needs:    Medical: Not on file    Non-medical: Not on file  Tobacco Use  . Smoking status: Never Smoker  . Smokeless tobacco: Never Used  Substance and Sexual Activity  . Alcohol use: Not Currently  . Drug use: Never  . Sexual activity: Yes  Lifestyle  . Physical activity:    Days per week: Not on file    Minutes per session: Not on file  . Stress: Not on file  Relationships  . Social connections:    Talks on phone: Not on file    Gets together: Not on file    Attends religious service: Not on file    Active member of club or organization: Not on file    Attends meetings of clubs or organizations: Not on file    Relationship status: Not on file  . Intimate partner violence:    Fear of current or ex partner: Not on file    Emotionally abused: Not on file    Physically abused: Not on file    Forced sexual activity: Not on file  Other Topics Concern  . Not on file  Social History Narrative  . Not on file      Review of Systems  Constitutional: Negative for chills, fatigue and unexpected weight change.  HENT: Positive for postnasal drip and sinus pain. Negative for congestion, rhinorrhea, sneezing and sore throat.   Eyes: Negative for photophobia, pain and redness.  Respiratory: Positive for cough. Negative for chest tightness and shortness of breath.   Cardiovascular: Negative for chest pain and palpitations.  Gastrointestinal: Negative for abdominal pain, constipation, diarrhea, nausea and vomiting.  Endocrine: Negative.   Genitourinary: Negative for dysuria and frequency.  Musculoskeletal: Negative for arthralgias, back pain, joint swelling and neck pain.  Skin: Negative for rash.  Allergic/Immunologic: Negative.   Neurological: Negative for tremors and numbness.  Hematological: Negative for adenopathy. Does not bruise/bleed easily.   Psychiatric/Behavioral: Negative for behavioral problems and sleep disturbance. The patient is not nervous/anxious.     Vital Signs: BP 131/77   Pulse 78   Resp 16   Ht 5\' 4"  (1.626 m)   Wt 191 lb (86.6 kg)   SpO2 99%   BMI 32.79 kg/m    Physical Exam Vitals signs and nursing note reviewed.  Constitutional:      General: She is not in acute distress.    Appearance: She is well-developed. She is not diaphoretic.  HENT:     Head: Normocephalic and atraumatic.     Mouth/Throat:     Pharynx: No oropharyngeal exudate.  Eyes:     Pupils: Pupils are equal, round, and reactive to light.  Neck:     Musculoskeletal: Normal range of motion and neck supple.     Thyroid: No thyromegaly.     Vascular: No JVD.     Trachea: No tracheal deviation.  Cardiovascular:     Rate and Rhythm: Normal rate and regular rhythm.     Heart sounds: Normal heart sounds. No murmur. No friction rub. No gallop.   Pulmonary:     Effort: Pulmonary effort is normal. No respiratory distress.     Breath sounds: Normal breath sounds. No wheezing or rales.  Chest:     Chest wall: No tenderness.  Abdominal:     Palpations: Abdomen is soft.     Tenderness: There is no abdominal tenderness. There is no guarding.  Musculoskeletal: Normal range of motion.  Lymphadenopathy:     Cervical: No cervical adenopathy.  Skin:    General: Skin is warm and dry.  Neurological:     Mental Status: She is alert and oriented to person, place, and time.     Cranial Nerves: No cranial nerve deficit.  Psychiatric:        Behavior: Behavior normal.        Thought Content: Thought content normal.        Judgment: Judgment normal.        Assessment/Plan: 1. Acute non-recurrent frontal sinusitis Patient prescribed a course of Bactrim for sinus infection.  Instructed patient take all medication until complete and to return to clinic in 7 to 10 days if symptoms fail to improve. - sulfamethoxazole-trimethoprim (BACTRIM  DS,SEPTRA DS) 800-160 MG tablet; Take 1 tablet by mouth 2 (two) times daily.  Dispense: 20 tablet; Refill: 0  2. Cough Robitussin-AC prescription sent to pharmacy.  Instructed patient on using this medication alone.  Do not mix with alcohol or other narcotic medications.  Patient verbalized understanding. Reviewed risks and possible side effects associated with taking opiates, benzodiazepines and other CNS depressants. Combination of these could cause dizziness and drowsiness. Advised patient not to drive or operate machinery when taking these medications, as patient's and other's life  can be at risk and will have consequences. Patient verbalized understanding in this matter. Dependence and abuse for these drugs will be monitored closely. A Controlled substance policy and procedure is on file which allows [facility] to order a urine drug screen test at any visit. Patient understands and agrees with the plan - guaiFENesin-codeine (ROBITUSSIN AC) 100-10 MG/5ML syrup; Take 5 mLs by mouth at bedtime as needed for cough.  Dispense: 118 mL; Refill: 0  General Counseling: Judaea verbalizes understanding of the findings of todays visit and agrees with plan of treatment. I have discussed any further diagnostic evaluation that may be needed or ordered today. We also reviewed her medications today. she has been encouraged to call the office with any questions or concerns that should arise related to todays visit.    No orders of the defined types were placed in this encounter.   Meds ordered this encounter  Medications  . sulfamethoxazole-trimethoprim (BACTRIM DS,SEPTRA DS) 800-160 MG tablet    Sig: Take 1 tablet by mouth 2 (two) times daily.    Dispense:  20 tablet    Refill:  0  . guaiFENesin-codeine (ROBITUSSIN AC) 100-10 MG/5ML syrup    Sig: Take 5 mLs by mouth at bedtime as needed for cough.    Dispense:  118 mL    Refill:  0    Time spent: 20 Minutes   This patient was seen by  Blima Ledger AGNP-C in Collaboration with Dr Lyndon Code as a part of collaborative care agreement     Johnna Acosta AGNP-C Internal medicine

## 2018-04-24 DIAGNOSIS — K080 Exfoliation of teeth due to systemic causes: Secondary | ICD-10-CM | POA: Diagnosis not present

## 2018-04-25 ENCOUNTER — Other Ambulatory Visit: Payer: Self-pay

## 2018-04-25 ENCOUNTER — Encounter: Payer: Self-pay | Admitting: Adult Health

## 2018-04-25 ENCOUNTER — Ambulatory Visit: Payer: Federal, State, Local not specified - PPO | Admitting: Adult Health

## 2018-04-25 VITALS — BP 130/89 | HR 71 | Temp 97.9°F | Resp 16 | Ht 64.0 in | Wt 194.0 lb

## 2018-04-25 DIAGNOSIS — R05 Cough: Secondary | ICD-10-CM | POA: Diagnosis not present

## 2018-04-25 DIAGNOSIS — K219 Gastro-esophageal reflux disease without esophagitis: Secondary | ICD-10-CM | POA: Diagnosis not present

## 2018-04-25 DIAGNOSIS — R059 Cough, unspecified: Secondary | ICD-10-CM

## 2018-04-25 DIAGNOSIS — J0111 Acute recurrent frontal sinusitis: Secondary | ICD-10-CM | POA: Diagnosis not present

## 2018-04-25 MED ORDER — CEFDINIR 300 MG PO CAPS: 300.0000 mg | ORAL_CAPSULE | Freq: Two times a day (BID) | ORAL | 0 refills | Status: DC

## 2018-04-25 NOTE — Patient Instructions (Signed)

## 2018-04-25 NOTE — Progress Notes (Signed)
[facility] [address] [place], [place] [address]  Internal MEDICINE  Office Visit Note  Patient Name: Diana Black  [id number]  [id number]  Date of Service: 04/26/2018  Chief Complaint  Patient presents with  . Sinusitis    had sinus infection and was treated , husband came back from [place] 3 weeks ago , both got sick and now feel like its lingering on      HPI Pt is here for a sick visit. Pt reports her sinus symptoms have returned.  Her husband traveled to [place], and came home and they both became sick. She reports it started with severe cough, however now its just sinus pressure and pain that is lingering.    Current Medication:  Outpatient Encounter Medications as of 04/25/2018  Medication Sig  . fluticasone (FLONASE) 50 MCG/ACT nasal spray Place into both nostrils daily.  Marland Kitchen omeprazole (PRILOSEC) 20 MG capsule Take 1 capsule (20 mg total) by mouth daily.  . valACYclovir (VALTREX) 1000 MG tablet Take 1 tablet (1,000 mg total) by mouth 3 (three) times daily.  . cefdinir (OMNICEF) 300 MG capsule Take 1 capsule (300 mg total) by mouth 2 (two) times daily.  . [DISCONTINUED] guaiFENesin-codeine (ROBITUSSIN AC) 100-10 MG/5ML syrup Take 5 mLs by mouth at bedtime as needed for cough. (Patient not taking: Reported on 04/25/2018)  . [DISCONTINUED] sulfamethoxazole-trimethoprim (BACTRIM DS,SEPTRA DS) 800-160 MG tablet Take 1 tablet by mouth 2 (two) times daily. (Patient not taking: Reported on 04/25/2018)   No facility-administered encounter medications on file as of 04/25/2018.       Medical History: Past Medical History:  Diagnosis Date  . Environmental allergies   . GERD (gastroesophageal reflux disease)      Vital Signs: BP 130/89   Pulse 71   Temp 97.9 F (36.6 C)   Resp 16   Ht 5\' 4"  (1.626 m)   Wt 194 lb (88 kg)   SpO2 99%   BMI 33.30 kg/m    Review of Systems  Constitutional: Negative for chills, fatigue and unexpected  weight change.  HENT: Positive for postnasal drip, sinus pressure, sinus pain and sore throat. Negative for congestion, rhinorrhea and sneezing.   Eyes: Negative for photophobia, pain and redness.  Respiratory: Negative for cough, chest tightness and shortness of breath.   Cardiovascular: Negative for chest pain and palpitations.  Gastrointestinal: Negative for abdominal pain, constipation, diarrhea, nausea and vomiting.  Endocrine: Negative.   Genitourinary: Negative for dysuria and frequency.  Musculoskeletal: Negative for arthralgias, back pain, joint swelling and neck pain.  Skin: Negative for rash.  Allergic/Immunologic: Negative.   Neurological: Negative for tremors and numbness.  Hematological: Negative for adenopathy. Does not bruise/bleed easily.  Psychiatric/Behavioral: Negative for behavioral problems and sleep disturbance. The patient is not nervous/anxious.     Physical Exam Vitals signs and nursing note reviewed.  Constitutional:      General: She is not in acute distress.    Appearance: She is well-developed. She is not diaphoretic.  HENT:     Head: Normocephalic and atraumatic.     Mouth/Throat:     Pharynx: No oropharyngeal exudate.  Eyes:     Pupils: Pupils are equal, round, and reactive to light.  Neck:     Musculoskeletal: Normal range of motion and neck supple.     Thyroid: No thyromegaly.     Vascular: No JVD.     Trachea: No tracheal deviation.  Cardiovascular:     Rate and Rhythm: Normal  rate and regular rhythm.     Heart sounds: Normal heart sounds. No murmur. No friction rub. No gallop.   Pulmonary:     Effort: Pulmonary effort is normal. No respiratory distress.     Breath sounds: Normal breath sounds. No wheezing or rales.  Chest:     Chest wall: No tenderness.  Abdominal:     Palpations: Abdomen is soft.     Tenderness: There is no abdominal tenderness. There is no guarding.  Musculoskeletal: Normal range of motion.  Lymphadenopathy:      Cervical: No cervical adenopathy.  Skin:    General: Skin is warm and dry.  Neurological:     Mental Status: She is alert and oriented to person, place, and time.     Cranial Nerves: No cranial nerve deficit.  Psychiatric:        Behavior: Behavior normal.        Thought Content: Thought content normal.        Judgment: Judgment normal.    Assessment/Plan: 1. Acute recurrent frontal sinusitis Advised patient to take entire course of antibiotics as prescribed with food. Pt should return to clinic in 7-10 days if symptoms fail to improve or new symptoms develop.  - cefdinir (OMNICEF) 300 MG capsule; Take 1 capsule (300 mg total) by mouth 2 (two) times daily.  Dispense: 20 capsule; Refill: 0  2. Cough Likely due to drainage.  Will reassess if cough lingers beyond antibiotics.   3. Gastroesophageal reflux disease without esophagitis Stable, continue present management.   General Counseling: lorane tomasino understanding of the findings of todays visit and agrees with plan of treatment. I have discussed any further diagnostic evaluation that may be needed or ordered today. We also reviewed her medications today. she has been encouraged to call the office with any questions or concerns that should arise related to todays visit.   No orders of the defined types were placed in this encounter.   Meds ordered this encounter  Medications  . cefdinir (OMNICEF) 300 MG capsule    Sig: Take 1 capsule (300 mg total) by mouth 2 (two) times daily.    Dispense:  20 capsule    Refill:  0    Time spent: 25 Minutes  This patient was seen by Blima Ledger AGNP-C in Collaboration with Dr Lyndon Code as a part of collaborative care agreement.  Johnna Acosta AGNP-C Internal Medicine

## 2018-05-22 ENCOUNTER — Other Ambulatory Visit: Payer: Self-pay

## 2018-05-22 DIAGNOSIS — K219 Gastro-esophageal reflux disease without esophagitis: Secondary | ICD-10-CM

## 2018-05-22 MED ORDER — OMEPRAZOLE 20 MG PO CPDR: 20.0000 mg | DELAYED_RELEASE_CAPSULE | Freq: Every day | ORAL | 1 refills | Status: DC

## 2018-07-17 ENCOUNTER — Other Ambulatory Visit: Payer: Self-pay

## 2018-07-17 DIAGNOSIS — K219 Gastro-esophageal reflux disease without esophagitis: Secondary | ICD-10-CM

## 2018-07-17 MED ORDER — OMEPRAZOLE 20 MG PO CPDR: 20.0000 mg | DELAYED_RELEASE_CAPSULE | Freq: Every day | ORAL | 1 refills | Status: DC

## 2018-08-04 ENCOUNTER — Ambulatory Visit: Payer: Federal, State, Local not specified - PPO | Admitting: Nurse Practitioner

## 2018-08-04 ENCOUNTER — Other Ambulatory Visit: Payer: Self-pay

## 2018-08-04 ENCOUNTER — Other Ambulatory Visit: Payer: Self-pay | Admitting: Nurse Practitioner

## 2018-08-04 ENCOUNTER — Encounter: Payer: Self-pay | Admitting: Nurse Practitioner

## 2018-08-04 VITALS — BP 118/78 | HR 61 | Resp 16 | Ht 64.0 in | Wt 191.6 lb

## 2018-08-04 DIAGNOSIS — E559 Vitamin D deficiency, unspecified: Secondary | ICD-10-CM | POA: Diagnosis not present

## 2018-08-04 DIAGNOSIS — Z111 Encounter for screening for respiratory tuberculosis: Secondary | ICD-10-CM | POA: Diagnosis not present

## 2018-08-04 DIAGNOSIS — Z0001 Encounter for general adult medical examination with abnormal findings: Secondary | ICD-10-CM

## 2018-08-04 DIAGNOSIS — R300 Dysuria: Secondary | ICD-10-CM

## 2018-08-04 DIAGNOSIS — Z7184 Encounter for health counseling related to travel: Secondary | ICD-10-CM | POA: Diagnosis not present

## 2018-08-04 LAB — LIPID PANEL W/O CHOL/HDL RATIO

## 2018-08-04 NOTE — Progress Notes (Signed)
[facility] [place], [place] [address]  Internal MEDICINE  Office Visit Note  Patient Name: Diana Black  [id number]  [id number]  Date of Service: 08/04/2018   Pt is here for routine health maintenance examination  Chief Complaint  Patient presents with  . Annual Exam  . Gastroesophageal Reflux  . Letter for School/Work    Health form      The patient is here for health maintenance exam. She is getting ready to travel to [place] with her husband. She has brought a travel health screen form with her which indicates that she needs to have further lab work done. She needs to have test for tuberculosis, HIV, and blood typing done. They are also requiring an ECG and routine labs. The patient states that she is feeling well and has no concerns or complaints today. She sees a GYN provider who performs well woman care, including pap smears and mammograms. Her next appointmetn is in August, 2020.     Current Medication: Outpatient Encounter Medications as of 08/04/2018  Medication Sig  . omeprazole (PRILOSEC) 20 MG capsule Take 1 capsule (20 mg total) by mouth daily.  . cefdinir (OMNICEF) 300 MG capsule Take 1 capsule (300 mg total) by mouth 2 (two) times daily. (Patient not taking: Reported on 08/04/2018)  . fluticasone (FLONASE) 50 MCG/ACT nasal spray Place into both nostrils daily.  . valACYclovir (VALTREX) 1000 MG tablet Take 1 tablet (1,000 mg total) by mouth 3 (three) times daily. (Patient not taking: Reported on 08/04/2018)   No facility-administered encounter medications on file as of 08/04/2018.     Surgical History: Past Surgical History:  Procedure Laterality Date  . CESAREAN SECTION      Medical History: Past Medical History:  Diagnosis Date  . Environmental allergies   . GERD (gastroesophageal reflux disease)     Family History: Family History  Problem Relation Age of Onset  . Diabetes Mother   . Bladder Cancer Mother   . Memory  loss Mother   . Stroke Father       Review of Systems  Constitutional: Negative for chills, fatigue and unexpected weight change.  HENT: Negative for congestion, rhinorrhea, sneezing and sore throat.   Respiratory: Negative for cough, chest tightness, shortness of breath and wheezing.   Cardiovascular: Negative for chest pain and palpitations.  Gastrointestinal: Negative for abdominal pain, constipation, diarrhea, nausea and vomiting.  Endocrine: Negative for cold intolerance, heat intolerance, polydipsia and polyuria.  Musculoskeletal: Negative for arthralgias, back pain, joint swelling and neck pain.  Skin: Negative for rash.  Allergic/Immunologic: Negative for environmental allergies.  Neurological: Negative for dizziness, tremors, numbness and headaches.  Hematological: Negative for adenopathy. Does not bruise/bleed easily.  Psychiatric/Behavioral: Negative for behavioral problems and sleep disturbance. The patient is not nervous/anxious.      Today's Vitals   08/04/18 0912  BP: 118/78  Pulse: 61  Resp: 16  SpO2: 100%  Weight: 191 lb 9.6 oz (86.9 kg)  Height: 5\' 4"  (1.626 m)   Body mass index is 32.89 kg/m.  Physical Exam Vitals signs and nursing note reviewed.  Constitutional:      General: She is not in acute distress.    Appearance: Normal appearance. She is well-developed. She is not diaphoretic.  HENT:     Head: Normocephalic and atraumatic.     Nose: Nose normal.     Mouth/Throat:     Pharynx: No oropharyngeal exudate.  Eyes:     Pupils: Pupils are  equal, round, and reactive to light.  Neck:     Musculoskeletal: Normal range of motion and neck supple.     Thyroid: No thyromegaly.     Vascular: No JVD.     Trachea: No tracheal deviation.  Cardiovascular:     Rate and Rhythm: Normal rate and regular rhythm.     Pulses: Normal pulses.     Heart sounds: Normal heart sounds. No murmur. No friction rub. No gallop.      Comments: ECG is within normal limits.   Pulmonary:     Effort: Pulmonary effort is normal. No respiratory distress.     Breath sounds: Normal breath sounds. No wheezing or rales.  Chest:     Chest wall: No tenderness.  Abdominal:     General: Bowel sounds are normal.     Palpations: Abdomen is soft.     Tenderness: There is no abdominal tenderness. There is no guarding.  Musculoskeletal: Normal range of motion.  Lymphadenopathy:     Cervical: No cervical adenopathy.  Skin:    General: Skin is warm and dry.  Neurological:     Mental Status: She is alert and oriented to person, place, and time.     Cranial Nerves: No cranial nerve deficit.  Psychiatric:        Behavior: Behavior normal.        Thought Content: Thought content normal.        Judgment: Judgment normal.   Assessment/Plan:  1. Encounter for health maintenance examination with abnormal findings Annual health maintenance exam today  2. Encounter for screening for respiratory tuberculosis Order given for quantiferon gold.   3. Encounter for counseling for travel - EKG 12-Lead is withing normal limits   4. Dysuria - UA/M w/rflx Culture, Routine  General Counseling: Diana DroneBrenda verbalizes understanding of the findings of todays visit and agrees with plan of treatment. I have discussed any further diagnostic evaluation that may be needed or ordered today. We also reviewed her medications today. she has been encouraged to call the office with any questions or concerns that should arise related to todays visit.    Counseling:  This patient was seen by Vincent GrosHeather Akshay Spang FNP Collaboration with Dr Lyndon CodeFozia M Khan as a part of collaborative care agreement  Orders Placed This Encounter  Procedures  . UA/M w/rflx Culture, Routine  . EKG 12-Lead    Time spent: 6245 Minutes      Lyndon CodeFozia M Khan, MD  Internal Medicine

## 2018-08-07 LAB — URINE CULTURE, REFLEX

## 2018-08-08 LAB — UA/M W/RFLX CULTURE, ROUTINE
Bilirubin, UA: NEGATIVE
Glucose, UA: NEGATIVE
Ketones, UA: NEGATIVE
Nitrite, UA: NEGATIVE
Protein,UA: NEGATIVE
Specific Gravity, UA: 1.008 (ref 1.005–1.030)
Urobilinogen, Ur: 0.2 mg/dL (ref 0.2–1.0)
pH, UA: 7.5 (ref 5.0–7.5)

## 2018-08-08 LAB — COMPREHENSIVE METABOLIC PANEL
ALT: 15 IU/L (ref 0–32)
AST: 21 IU/L (ref 0–40)
Albumin/Globulin Ratio: 1.8 (ref 1.2–2.2)
Albumin: 4.4 g/dL (ref 3.8–4.9)
Alkaline Phosphatase: 86 IU/L (ref 39–117)
BUN/Creatinine Ratio: 13 (ref 9–23)
BUN: 11 mg/dL (ref 6–24)
Bilirubin Total: 0.4 mg/dL (ref 0.0–1.2)
CO2: 21 mmol/L (ref 20–29)
Calcium: 9.2 mg/dL (ref 8.7–10.2)
Chloride: 103 mmol/L (ref 96–106)
Creatinine, Ser: 0.86 mg/dL (ref 0.57–1.00)
GFR calc Af Amer: 90 mL/min/{1.73_m2} (ref >59)
GFR calc non Af Amer: 78 mL/min/{1.73_m2} (ref >59)
Globulin, Total: 2.4 g/dL (ref 1.5–4.5)
Glucose: 84 mg/dL (ref 65–99)
Potassium: 4.5 mmol/L (ref 3.5–5.2)
Sodium: 138 mmol/L (ref 134–144)
Total Protein: 6.8 g/dL (ref 6.0–8.5)

## 2018-08-08 LAB — QUANTIFERON-TB GOLD PLUS
QuantiFERON Mitogen Value: >10 IU/mL
QuantiFERON Nil Value: 0.02 IU/mL
QuantiFERON TB1 Ag Value: 0.02 IU/mL
QuantiFERON TB2 Ag Value: 0.03 IU/mL
QuantiFERON-TB Gold Plus: NEGATIVE

## 2018-08-08 LAB — URINE CULTURE, REFLEX

## 2018-08-08 LAB — MICROSCOPIC EXAMINATION
Casts: NONE SEEN /lpf
RBC, Urine: NONE SEEN /hpf (ref 0–2)

## 2018-08-08 LAB — ABO/RH: Rh Factor: POSITIVE

## 2018-08-08 LAB — GLUCOSE 6 PHOSPHATE DEHYDROGENASE: G-6-PD, Quant: 8.5 U/g{Hb} (ref 4.6–13.5)

## 2018-08-08 LAB — T3: T3, Total: 131 ng/dL (ref 71–180)

## 2018-08-08 LAB — CBC
Hematocrit: 42.6 % (ref 34.0–46.6)
Hemoglobin: 14.4 g/dL (ref 11.1–15.9)
MCH: 30.6 pg (ref 26.6–33.0)
MCHC: 33.8 g/dL (ref 31.5–35.7)
MCV: 90 fL (ref 79–97)
Platelets: 286 10*3/uL (ref 150–450)
RBC: 4.71 x10E6/uL (ref 3.77–5.28)
RDW: 13.1 % (ref 11.7–15.4)
WBC: 6.3 10*3/uL (ref 3.4–10.8)

## 2018-08-08 LAB — T4, FREE: Free T4: 1.09 ng/dL (ref 0.82–1.77)

## 2018-08-08 LAB — HIV ANTIBODY (ROUTINE TESTING W REFLEX): HIV Screen 4th Generation wRfx: NONREACTIVE

## 2018-08-08 LAB — VITAMIN D 25 HYDROXY (VIT D DEFICIENCY, FRACTURES): Vit D, 25-Hydroxy: 20 ng/mL — ABNORMAL LOW (ref 30.0–100.0)

## 2018-08-08 LAB — LIPID PANEL W/O CHOL/HDL RATIO
Cholesterol, Total: 225 mg/dL — ABNORMAL HIGH (ref 100–199)
HDL: 63 mg/dL (ref >39)
LDL Calculated: 147 mg/dL — ABNORMAL HIGH (ref 0–99)
Triglycerides: 75 mg/dL (ref 0–149)
VLDL Cholesterol Cal: 15 mg/dL (ref 5–40)

## 2018-08-08 LAB — TSH: TSH: 1.77 u[IU]/mL (ref 0.450–4.500)

## 2018-08-08 LAB — HCV INTERPRETATION

## 2018-08-08 LAB — HCV AB W REFLEX TO QUANT PCR: HCV Ab: <0.1 s/co ratio (ref 0.0–0.9)

## 2018-09-07 DIAGNOSIS — H401131 Primary open-angle glaucoma, bilateral, mild stage: Secondary | ICD-10-CM | POA: Diagnosis not present

## 2018-09-13 ENCOUNTER — Other Ambulatory Visit: Payer: Self-pay | Admitting: Adult Health

## 2018-09-13 DIAGNOSIS — K219 Gastro-esophageal reflux disease without esophagitis: Secondary | ICD-10-CM

## 2018-09-22 ENCOUNTER — Ambulatory Visit (INDEPENDENT_AMBULATORY_CARE_PROVIDER_SITE_OTHER): Payer: Federal, State, Local not specified - PPO | Admitting: Nurse Practitioner

## 2018-09-22 ENCOUNTER — Other Ambulatory Visit: Payer: Self-pay

## 2018-09-22 ENCOUNTER — Encounter: Payer: Self-pay | Admitting: Nurse Practitioner

## 2018-09-22 VITALS — Ht 64.0 in | Wt 188.8 lb

## 2018-09-22 DIAGNOSIS — R010 Benign and innocent cardiac murmurs: Secondary | ICD-10-CM

## 2018-09-22 DIAGNOSIS — Z7184 Encounter for health counseling related to travel: Secondary | ICD-10-CM | POA: Diagnosis not present

## 2018-09-22 NOTE — Progress Notes (Signed)
[facility], [place] [address]  Internal MEDICINE  Telephone Visit  Patient Name: Diana Black  [id number]  [id number]  Date of Service: 09/24/2018  I connected with the patient at 9:24am by webcam and verified the patients identity using two identifiers.   I discussed the limitations, risks, security and privacy concerns of performing an evaluation and management service by webcam and the availability of in person appointments. I also discussed with the patient that there may be a patient responsible charge related to the service.  The patient expressed understanding and agrees to proceed.    Chief Complaint  Patient presents with  . Telephone Assessment  . Telephone Screen  . Paperwork  . Medical Clearance    The patient has been contacted via webcam for follow up visit due to concerns for spread of novel coronavirus. The patient states that employer needs to have additional paperwork to submit for her to have clearance to go overseas to work with her husband. They are requesting a copy of the labs which were initially drawn for initial clearance paperwork. There is also question regarding cardiac murmur. Patient states that she has history of cardiac murmur and indicated this on her clearance paperwork. A murmur has not been appreciated in this office for some time. An ECG was done 08/04/2018 and was completely normal. There is question to when the last time she has seen cardiologist or if she needs to have additional cardiac clearance prior to going overseas.       Current Medication: Outpatient Encounter Medications as of 09/22/2018  Medication Sig  . fluticasone (FLONASE) 50 MCG/ACT nasal spray Place into both nostrils daily.  Marland Kitchen omeprazole (PRILOSEC) 20 MG capsule TAKE 1 CAPSULE(20 MG) BY MOUTH DAILY  . [DISCONTINUED] cefdinir (OMNICEF) 300 MG capsule Take 1 capsule (300 mg total) by mouth 2 (two) times daily. (Patient not taking: Reported on  08/04/2018)  . [DISCONTINUED] valACYclovir (VALTREX) 1000 MG tablet Take 1 tablet (1,000 mg total) by mouth 3 (three) times daily. (Patient not taking: Reported on 08/04/2018)   No facility-administered encounter medications on file as of 09/22/2018.     Surgical History: Past Surgical History:  Procedure Laterality Date  . CESAREAN SECTION      Medical History: Past Medical History:  Diagnosis Date  . Environmental allergies   . GERD (gastroesophageal reflux disease)     Diana Black History: Diana Black History  Problem Relation Age of Onset  . Diabetes Mother   . Bladder Cancer Mother   . Memory loss Mother   . Stroke Father     Social History   Socioeconomic History  . Marital status: Married    Spouse name: Not on file  . Number of children: Not on file  . Years of education: Not on file  . Highest education level: Not on file  Occupational History  . Not on file  Social Needs  . Financial resource strain: Not on file  . Food insecurity    Worry: Not on file    Inability: Not on file  . Transportation needs    Medical: Not on file    Non-medical: Not on file  Tobacco Use  . Smoking status: Never Smoker  . Smokeless tobacco: Never Used  Substance and Sexual Activity  . Alcohol use: Not Currently  . Drug use: Never  . Sexual activity: Yes  Lifestyle  . Physical activity    Days per week: Not on file    Minutes  per session: Not on file  . Stress: Not on file  Relationships  . Social Musicianconnections    Talks on phone: Not on file    Gets together: Not on file    Attends religious service: Not on file    Active member of club or organization: Not on file    Attends meetings of clubs or organizations: Not on file    Relationship status: Not on file  . Intimate partner violence    Fear of current or ex partner: Not on file    Emotionally abused: Not on file    Physically abused: Not on file    Forced sexual activity: Not on file  Other Topics Concern  . Not on file   Social History Narrative  . Not on file      Review of Systems  Constitutional: Negative for chills, fatigue and unexpected weight change.  HENT: Negative for congestion, rhinorrhea, sneezing and sore throat.   Respiratory: Negative for cough, chest tightness, shortness of breath and wheezing.   Cardiovascular: Negative for chest pain and palpitations.  Gastrointestinal: Negative for abdominal pain, constipation, diarrhea, nausea and vomiting.  Endocrine: Negative for cold intolerance, heat intolerance, polydipsia and polyuria.  Musculoskeletal: Negative for arthralgias, back pain, joint swelling and neck pain.  Skin: Negative for rash.  Allergic/Immunologic: Negative for environmental allergies.  Neurological: Negative for dizziness, tremors, numbness and headaches.  Hematological: Negative for adenopathy. Does not bruise/bleed easily.  Psychiatric/Behavioral: Negative for behavioral problems and sleep disturbance. The patient is not nervous/anxious.    Today's Vitals   09/22/18 0850  Weight: 188 lb 12.8 oz (85.6 kg)  Height: 5\' 4"  (1.626 m)   Body mass index is 32.41 kg/m.  Observation/Objective:   The patient is alert and oriented. She is pleasant and answers all questions appropriately. Breathing is non-labored. She is in no acute distress at this time.    Assessment/Plan: 1. Encounter for counseling for travel Will print out patient;s lab results and ECG so she may submit with her clearance paperwork for travel.   2. Benign heart murmur Heart murmur has not been appreciated in some time. Prior cardiac work up has been benign in nature. ECG, done at her last visit, was within normal limits. Patient may travel overseas with minimal risk due to prior cardiac murmur.   General Counseling: Diana DryerBrenda verbalizes understanding of the findings of today's phone visit and agrees with plan of treatment. I have discussed any further diagnostic evaluation that may be needed or ordered  today. We also reviewed her medications today. she has been encouraged to call the office with any questions or concerns that should arise related to todays visit.   This patient was seen by Vincent GrosHeather Mina Carlisi FNP Collaboration with Dr Lyndon CodeFozia M Khan as a part of collaborative care agreement   Time spent: 25 Minutes    Dr Lyndon CodeFozia M Khan Internal medicine

## 2018-09-24 DIAGNOSIS — R010 Benign and innocent cardiac murmurs: Secondary | ICD-10-CM | POA: Insufficient documentation

## 2018-10-03 DIAGNOSIS — H401131 Primary open-angle glaucoma, bilateral, mild stage: Secondary | ICD-10-CM | POA: Diagnosis not present

## 2018-10-05 DIAGNOSIS — Z01419 Encounter for gynecological examination (general) (routine) without abnormal findings: Secondary | ICD-10-CM | POA: Diagnosis not present

## 2018-10-05 DIAGNOSIS — Z1231 Encounter for screening mammogram for malignant neoplasm of breast: Secondary | ICD-10-CM | POA: Diagnosis not present

## 2018-10-05 DIAGNOSIS — Z6833 Body mass index (BMI) 33.0-33.9, adult: Secondary | ICD-10-CM | POA: Diagnosis not present

## 2018-11-06 ENCOUNTER — Telehealth: Payer: Self-pay

## 2018-11-08 ENCOUNTER — Other Ambulatory Visit: Payer: Self-pay | Admitting: Nurse Practitioner

## 2018-11-08 DIAGNOSIS — Z23 Encounter for immunization: Secondary | ICD-10-CM

## 2018-11-08 MED ORDER — HEPATITIS A VACCINE 1440 EL U/ML IM SUSP: INTRAMUSCULAR | 1 refills | Status: DC

## 2018-11-08 NOTE — Progress Notes (Signed)
Sent prescription for hepatitis A vaccine to [facility]. Initial dose should be given at least 2 weeks before travel. Second dose should be given within 6 to 12 months.

## 2018-11-08 NOTE — Telephone Encounter (Signed)
Pt was notified.  

## 2018-11-08 NOTE — Telephone Encounter (Signed)
Sent prescription for hepatitis A vaccine to [facility]. Initial dose should be given at least 2 weeks before travel. Second dose should be given within 6 to 12 months. 

## 2018-11-14 ENCOUNTER — Other Ambulatory Visit: Payer: Self-pay

## 2018-11-14 DIAGNOSIS — K219 Gastro-esophageal reflux disease without esophagitis: Secondary | ICD-10-CM

## 2018-11-14 MED ORDER — OMEPRAZOLE 20 MG PO CPDR: DELAYED_RELEASE_CAPSULE | ORAL | 1 refills | Status: DC

## 2018-12-14 ENCOUNTER — Other Ambulatory Visit: Payer: Self-pay

## 2018-12-14 ENCOUNTER — Ambulatory Visit: Payer: Federal, State, Local not specified - PPO | Admitting: Nurse Practitioner

## 2018-12-14 ENCOUNTER — Encounter: Payer: Self-pay | Admitting: Nurse Practitioner

## 2018-12-14 VITALS — BP 138/76 | HR 62 | Resp 16 | Ht 64.0 in | Wt 193.4 lb

## 2018-12-14 DIAGNOSIS — K219 Gastro-esophageal reflux disease without esophagitis: Secondary | ICD-10-CM

## 2018-12-14 DIAGNOSIS — F40243 Fear of flying: Secondary | ICD-10-CM

## 2018-12-14 MED ORDER — OMEPRAZOLE 20 MG PO CPDR: DELAYED_RELEASE_CAPSULE | ORAL | 1 refills | Status: DC

## 2018-12-14 NOTE — Progress Notes (Signed)
[facility] [address] [place], [place] [address]  Internal MEDICINE  Office Visit Note  Patient Name: Diana CooleyBrenda C Black  [id number]  [id number]  Date of Service: 12/27/2018  Chief Complaint  Patient presents with  . Travel Consult    needs a letter for an emotional support animal and long term rx for omeprazole     The patient is here for routine follow up. She [place] to [place] on 01/13/2019. She will be gone for two years. She does suffer from anxiety, especially in regard to travelling by air. She has found it comforting to have her dog, Glitter, travel with her. Glitter is this patient's emotional support dog. She is a [employer]ediatric [employer] dog, weighs around 10 pounds and fits into a small animal carrier.  The patient is also needing a one year prescription for her medication. She currently takes omeprazole 20mg  on most days.       Current Medication: Outpatient Encounter Medications as of 12/14/2018  Medication Sig  . fluticasone (FLONASE) 50 MCG/ACT nasal spray Place into both nostrils daily.  . hepatitis A virus, PF, vaccine (HAVRIX, PF,) 1440 EL U/ML injection Inject 1ml IM one time >2week prior to expected HAV exposure. Second dose should be given IM in 6 to 12 months.  Marland Kitchen. omeprazole (PRILOSEC) 20 MG capsule TAKE 1 CAPSULE(20 MG) BY MOUTH DAILY  . [DISCONTINUED] omeprazole (PRILOSEC) 20 MG capsule TAKE 1 CAPSULE(20 MG) BY MOUTH DAILY   No facility-administered encounter medications on file as of 12/14/2018.     Surgical History: Past Surgical History:  Procedure Laterality Date  . CESAREAN SECTION      Medical History: Past Medical History:  Diagnosis Date  . Environmental allergies   . GERD (gastroesophageal reflux disease)     Family History: Family History  Problem Relation Age of Onset  . Diabetes Mother   . Bladder Cancer Mother   . Memory loss Mother   . Stroke Father     Social History    Socioeconomic History  . Marital status: Married    Spouse name: Not on file  . Number of children: Not on file  . Years of education: Not on file  . Highest education level: Not on file  Occupational History  . Not on file  Social Needs  . Financial resource strain: Not on file  . Food insecurity    Worry: Not on file    Inability: Not on file  . Transportation needs    Medical: Not on file    Non-medical: Not on file  Tobacco Use  . Smoking status: Never Smoker  . Smokeless tobacco: Never Used  Substance and Sexual Activity  . Alcohol use: Not Currently  . Drug use: Never  . Sexual activity: Yes  Lifestyle  . Physical activity    Days per week: Not on file    Minutes per session: Not on file  . Stress: Not on file  Relationships  . Social Musicianconnections    Talks on phone: Not on file    Gets together: Not on file    Attends religious service: Not on file    Active member of club or organization: Not on file    Attends meetings of clubs or organizations: Not on file    Relationship status: Not on file  . Intimate partner violence    Fear of current or ex partner: Not on file    Emotionally abused: Not on  file    Physically abused: Not on file    Forced sexual activity: Not on file  Other Topics Concern  . Not on file  Social History Narrative  . Not on file      Review of Systems  Constitutional: Negative for activity change, chills, fatigue and unexpected weight change.  HENT: Negative for congestion, rhinorrhea, sneezing and sore throat.   Respiratory: Negative for cough, chest tightness, shortness of breath and wheezing.   Cardiovascular: Negative for chest pain and palpitations.  Gastrointestinal: Negative for abdominal pain, constipation, diarrhea, nausea and vomiting.       Intermittent GERD  Endocrine: Negative for cold intolerance, heat intolerance, polydipsia and polyuria.  Musculoskeletal: Negative for arthralgias, back pain, joint swelling and  neck pain.  Skin: Negative for rash.  Allergic/Immunologic: Negative for environmental allergies.  Neurological: Negative for dizziness, tremors, numbness and headaches.  Hematological: Negative for adenopathy. Does not bruise/bleed easily.  Psychiatric/Behavioral: Negative for behavioral problems and sleep disturbance. The patient is nervous/anxious.     Today's Vitals   12/14/18 1128  BP: 138/76  Pulse: 62  Resp: 16  SpO2: 99%  Weight: 193 lb 6.4 oz (87.7 kg)  Height: 5\' 4"  (1.626 m)   Body mass index is 33.2 kg/m.  Physical Exam Vitals signs and nursing note reviewed.  Constitutional:      General: She is not in acute distress.    Appearance: Normal appearance. She is well-developed. She is not diaphoretic.  HENT:     Head: Normocephalic and atraumatic.     Nose: Nose normal.     Mouth/Throat:     Pharynx: No oropharyngeal exudate.  Eyes:     Pupils: Pupils are equal, round, and reactive to light.  Neck:     Musculoskeletal: Normal range of motion and neck supple.     Thyroid: No thyromegaly.     Vascular: No JVD.     Trachea: No tracheal deviation.  Cardiovascular:     Rate and Rhythm: Normal rate and regular rhythm.     Pulses: Normal pulses.     Heart sounds: Normal heart sounds. No murmur. No friction rub. No gallop.      Comments: ECG is within normal limits.  Pulmonary:     Effort: Pulmonary effort is normal. No respiratory distress.     Breath sounds: Normal breath sounds. No wheezing or rales.  Chest:     Chest wall: No tenderness.  Abdominal:     General: Bowel sounds are normal.     Palpations: Abdomen is soft.     Tenderness: There is no abdominal tenderness. There is no guarding.  Musculoskeletal: Normal range of motion.  Lymphadenopathy:     Cervical: No cervical adenopathy.  Skin:    General: Skin is warm and dry.  Neurological:     Mental Status: She is alert and oriented to person, place, and time.     Cranial Nerves: No cranial nerve  deficit.  Psychiatric:        Behavior: Behavior normal.        Thought Content: Thought content normal.        Judgment: Judgment normal.    Assessment/Plan: 1. Gastroesophageal reflux disease without esophagitis - omeprazole (PRILOSEC) 20 MG capsule; TAKE 1 CAPSULE(20 MG) BY MOUTH DAILY  Dispense: 365 capsule; Refill: 1  2. Anxiety with flying She does suffer from anxiety, especially in regard to travelling by air. She has found it comforting to have her dog, Glitter, travel with  her. Diana Black is this patient's emotional support dog. Will compose letter which should allow her to travel overseas with her dog in hand carrier. Will contact patient when letter is ready to be picked up.   General Counseling: adelin ventrella understanding of the findings of todays visit and agrees with plan of treatment. I have discussed any further diagnostic evaluation that may be needed or ordered today. We also reviewed her medications today. she has been encouraged to call the office with any questions or concerns that should arise related to todays visit.  This patient was seen by Vincent Gros FNP Collaboration with Dr Lyndon Code as a part of collaborative care agreement  Meds ordered this encounter  Medications  . omeprazole (PRILOSEC) 20 MG capsule    Sig: TAKE 1 CAPSULE(20 MG) BY MOUTH DAILY    Dispense:  365 capsule    Refill:  1    This is a one year prescription as patient will be living overseas for the next two years.    Order Specific Question:   Supervising Provider    Answer:   Lyndon Code [[id number]]    Time spent: 33 Minutes      Dr Lyndon Code Internal medicine

## 2018-12-26 ENCOUNTER — Ambulatory Visit: Payer: Federal, State, Local not specified - PPO | Admitting: Nurse Practitioner

## 2018-12-27 DIAGNOSIS — F40243 Fear of flying: Secondary | ICD-10-CM | POA: Insufficient documentation

## 2019-02-13 ENCOUNTER — Ambulatory Visit: Payer: Federal, State, Local not specified - PPO | Admitting: Nurse Practitioner

## 2019-02-23 ENCOUNTER — Other Ambulatory Visit: Payer: Self-pay | Admitting: Nurse Practitioner

## 2019-02-23 DIAGNOSIS — K219 Gastro-esophageal reflux disease without esophagitis: Secondary | ICD-10-CM

## 2019-02-23 MED ORDER — OMEPRAZOLE 20 MG PO CPDR: DELAYED_RELEASE_CAPSULE | ORAL | 1 refills | Status: DC

## 2019-02-23 NOTE — Progress Notes (Signed)
90 day prescription for omeprazole was sent to [facility] pharmacy

## 2020-01-18 DIAGNOSIS — Z01419 Encounter for gynecological examination (general) (routine) without abnormal findings: Secondary | ICD-10-CM | POA: Diagnosis not present

## 2020-01-18 DIAGNOSIS — Z6831 Body mass index (BMI) 31.0-31.9, adult: Secondary | ICD-10-CM | POA: Diagnosis not present

## 2020-01-28 ENCOUNTER — Other Ambulatory Visit: Payer: Self-pay

## 2020-01-28 ENCOUNTER — Encounter: Payer: Self-pay | Admitting: Hospice and Palliative Medicine

## 2020-01-28 ENCOUNTER — Ambulatory Visit (INDEPENDENT_AMBULATORY_CARE_PROVIDER_SITE_OTHER): Payer: Federal, State, Local not specified - PPO | Admitting: Hospice and Palliative Medicine

## 2020-01-28 VITALS — BP 117/80 | HR 60 | Temp 98.0°F | Resp 16 | Ht 63.0 in | Wt 180.0 lb

## 2020-01-28 DIAGNOSIS — R300 Dysuria: Secondary | ICD-10-CM

## 2020-01-28 DIAGNOSIS — Z124 Encounter for screening for malignant neoplasm of cervix: Secondary | ICD-10-CM

## 2020-01-28 DIAGNOSIS — M25511 Pain in right shoulder: Secondary | ICD-10-CM

## 2020-01-28 DIAGNOSIS — K219 Gastro-esophageal reflux disease without esophagitis: Secondary | ICD-10-CM

## 2020-01-28 DIAGNOSIS — Z0001 Encounter for general adult medical examination with abnormal findings: Secondary | ICD-10-CM

## 2020-01-28 DIAGNOSIS — L918 Other hypertrophic disorders of the skin: Secondary | ICD-10-CM | POA: Diagnosis not present

## 2020-01-28 DIAGNOSIS — L739 Follicular disorder, unspecified: Secondary | ICD-10-CM

## 2020-01-28 MED ORDER — OMEPRAZOLE 20 MG PO CPDR
DELAYED_RELEASE_CAPSULE | ORAL | 1 refills | Status: DC
Start: 2020-01-28 — End: 2020-02-18

## 2020-01-28 NOTE — Progress Notes (Signed)
[facility] [address] [place], [place] [address]  Internal MEDICINE  Office Visit Note  Patient Name: Diana Black  [id number]  [id number]  Date of Service: 01/29/2020  Chief Complaint  Patient presents with  . Annual Exam        . Gastroesophageal Reflux  . other    Spot left leg  and left eye   . Shoulder Pain    Right      HPI Pt is here for routine health maintenance examination She has recently returned home from living in [place], her husband works within the [employer] and has been stationed there for the last several months They are only home for a short period and will be going back to [place] to live She has not been seen by a Art therapist since her last visit prior to moving There is a medical office within [employer] that she has access to if she becomes acutely ill They have adjusted to life in [place] has just secured a job within [employer] prior to being evacuated While she is here she would like to get all of her appointments taken care of  Continues to take omeprazole daily for GERD--symptoms have remained well controlled, she is requesting a year supply Scheduled tomorrow for mammogram  GYN provides her with [facility] testing--completed last year, normal Needs updated lab work  Complaints today include a small bump on her left lower leg, bump has been there for several months, at first she though it was a bug bit of some sort but it never healed or went away, at times the bump does itch but mostly causes no symptoms and she only remembers it is there when she is shaving a feels it She also has a small bump on her left upper eyelid--has also been there for several years, no symptoms associated with eye, no pain or discharge, no changes in vision Also complaining of right shoulder pain--since living in [place] she had started doing yoga for exercise--she typically like to walk.run outside for exercise but it is not  safe for her to do this in [place] thinks she may have overstretched her shoulder while practicing yoga, pain started about a month ago but not that she is home and not using her shoulder as much the pain has worsened  Adjusted well to living in a different country, stress and anxiety levels remain well controlled, she sleeps well at night--took some time but she has adjusted to food and cooking as well as drinking safe water  Current Medication: Outpatient Encounter Medications as of 01/28/2020  Medication Sig  . fluticasone (FLONASE) 50 MCG/ACT nasal spray Place into both nostrils daily.  . hepatitis A virus, PF, vaccine (HAVRIX, PF,) 1440 EL U/ML injection Inject 60ml IM one time >2week prior to expected HAV exposure. Second dose should be given IM in 6 to 12 months.  Marland Kitchen omeprazole (PRILOSEC) 20 MG capsule TAKE 1 CAPSULE(20 MG) BY MOUTH DAILY  . [DISCONTINUED] omeprazole (PRILOSEC) 20 MG capsule TAKE 1 CAPSULE(20 MG) BY MOUTH DAILY   No facility-administered encounter medications on file as of 01/28/2020.    Surgical History: Past Surgical History:  Procedure Laterality Date  . CESAREAN SECTION      Medical History: Past Medical History:  Diagnosis Date  . Environmental allergies   . GERD (gastroesophageal reflux disease)     Family History: Family History  Problem Relation Age of Onset  . Diabetes Mother   . Bladder Cancer Mother   .  Memory loss Mother   . Stroke Father     Review of Systems  Constitutional: Negative for chills, diaphoresis and fatigue.  HENT: Negative for ear pain, postnasal drip and sinus pressure.   Eyes: Negative for photophobia, discharge, redness, itching and visual disturbance.       Small bump "skin tag" on left upper eye lid  Respiratory: Negative for cough, shortness of breath and wheezing.   Cardiovascular: Negative for chest pain, palpitations and leg swelling.  Gastrointestinal: Negative for abdominal pain, constipation, diarrhea,  nausea and vomiting.  Genitourinary: Negative for dysuria and flank pain.  Musculoskeletal: Positive for arthralgias. Negative for back pain, gait problem and neck pain.       Right shoulder pain  Skin: Negative for color change.       Small bump on left lower leg  Allergic/Immunologic: Negative for environmental allergies and food allergies.  Neurological: Negative for dizziness and headaches.  Hematological: Does not bruise/bleed easily.  Psychiatric/Behavioral: Negative for agitation, behavioral problems (depression) and hallucinations.     Vital Signs: BP 117/80   Pulse 60   Temp 98 F (36.7 C)   Resp 16   Ht 5\' 3"  (1.6 m)   Wt 180 lb (81.6 kg)   SpO2 98%   BMI 31.89 kg/m   Physical Exam Vitals reviewed.  Constitutional:      Appearance: Normal appearance. She is obese.  HENT:     Right Ear: Tympanic membrane normal.     Left Ear: Tympanic membrane normal.     Nose: Nose normal.     Mouth/Throat:     Mouth: Mucous membranes are moist.     Pharynx: Oropharynx is clear.  Eyes:     Pupils: Pupils are equal, round, and reactive to light.  Cardiovascular:     Rate and Rhythm: Normal rate and regular rhythm.     Pulses: Normal pulses.     Heart sounds: Normal heart sounds.  Pulmonary:     Effort: Pulmonary effort is normal.     Breath sounds: Normal breath sounds.  Chest:  Breasts:     Right: Normal.     Left: Normal.    Abdominal:     Palpations: Abdomen is soft.  Musculoskeletal:        General: Normal range of motion.     Cervical back: Normal range of motion.     Comments: Right shoulder FROM, non-tender to palpation, feels an overstretching with ROM exercises  Skin:    General: Skin is warm.     Comments: Small, benign, skin tag to left upper eye lid and outer side of eye  Non-discolored, small raised area of left outer lower leg, non-tender  Neurological:     Mental Status: She is alert.      LABS: Recent Results (from the past 2160 hour(s))   UA/M w/rflx Culture, Routine     Status: Abnormal (Preliminary result)   Collection Time: 01/28/20 10:10 AM   Specimen: Urine   Urine  Result Value Ref Range   Specific Gravity, UA      <=1.005 (A) 1.005 - 1.030   pH, UA 7.5 5.0 - 7.5   Color, UA Yellow Yellow   Appearance Ur Clear Clear   Leukocytes,UA Trace (A) Negative   Protein,UA Negative Negative/Trace   Glucose, UA Negative Negative   Ketones, UA Negative Negative   RBC, UA Negative Negative   Bilirubin, UA Negative Negative   Urobilinogen, Ur 0.2 0.2 - 1.0 mg/dL  Nitrite, UA Negative Negative   Microscopic Examination See below:     Comment: Microscopic was indicated and was performed.   Urinalysis Reflex Comment     Comment: This specimen has reflexed to a Urine Culture.  Microscopic Examination     Status: None   Collection Time: 01/28/20 10:10 AM   Urine  Result Value Ref Range   WBC, UA None seen 0 - 5 /hpf   RBC None seen 0 - 2 /hpf   Epithelial Cells (non renal) 0-10 0 - 10 /hpf   Casts None seen None seen /lpf   Bacteria, UA None seen None seen/Few  Urine Culture, Reflex     Status: None (Preliminary result)   Collection Time: 01/28/20 10:10 AM   Urine  Result Value Ref Range   Urine Culture, Routine WILL FOLLOW    Assessment/Plan: 1. Encounter for routine adult health examination with abnormal findings Well appearing 54 year old female, scheduled for mammogram tomorrow, other PHM up to date Will review annual labs and adjust plan of care as indicated - CBC w/Diff/Platelet - Comprehensive Metabolic Panel (CMET) - Lipid Panel With LDL/HDL Ratio - TSH + free T4  2. Gastroesophageal reflux disease without esophagitis Symptoms remain stable on current therapy, will send for 1 year supply as [place] - omeprazole (PRILOSEC) 20 MG capsule; TAKE 1 CAPSULE(20 MG) BY MOUTH DAILY  Dispense: 365 capsule; Refill: 1  3. Acute pain of right shoulder Will obtain xray and refer to ortho  for further evaluation due to limited time available - Ambulatory referral to Orthopedic Surgery - DG Shoulder Right; Future  4. Cutaneous skin tags Benign appearing skin tags to left eyelid--requesting referral to dermatology for possible removal - Ambulatory referral to Dermatology  5. Folliculitis Folliculitis//unclear etiology of small raised area on left leg--slightly concerned due out of country travel No signs of infection Advised to also have dermatology assess area of left leg  6. Dysuria - UA/M w/rflx Culture, Routine - Microscopic Examination - Urine Culture, Reflex  General Counseling: Kobe verbalizes understanding of the findings of todays visit and agrees with plan of treatment. I have discussed any further diagnostic evaluation that may be needed or ordered today. We also reviewed her medications today. she has been encouraged to call the office with any questions or concerns that should arise related to todays visit.    Counseling:    Orders Placed This Encounter  Procedures  . Microscopic Examination  . Urine Culture, Reflex  . DG Shoulder Right  . UA/M w/rflx Culture, Routine  . CBC w/Diff/Platelet  . Comprehensive Metabolic Panel (CMET)  . Lipid Panel With LDL/HDL Ratio  . TSH + free T4  . Ambulatory referral to Orthopedic Surgery  . Ambulatory referral to Dermatology    Meds ordered this encounter  Medications  . omeprazole (PRILOSEC) 20 MG capsule    Sig: TAKE 1 CAPSULE(20 MG) BY MOUTH DAILY    Dispense:  365 capsule    Refill:  1    This is a one year prescription as patient [place] be living [place] the next two years.    Total time spent: 30 Minutes  Time spent includes review of chart, medications, test results, and follow up plan with the patient.   This patient was seen by Brent General AGNP-C Collaboration with Dr Lyndon Code as a part of collaborative care agreement   Lubertha Basque. [facility] Internal Medicine

## 2020-01-29 ENCOUNTER — Encounter: Payer: Self-pay | Admitting: Hospice and Palliative Medicine

## 2020-01-29 DIAGNOSIS — Z1231 Encounter for screening mammogram for malignant neoplasm of breast: Secondary | ICD-10-CM | POA: Diagnosis not present

## 2020-01-30 DIAGNOSIS — Z0001 Encounter for general adult medical examination with abnormal findings: Secondary | ICD-10-CM | POA: Diagnosis not present

## 2020-01-31 LAB — CBC WITH DIFFERENTIAL/PLATELET
Basophils Absolute: 0.1 10*3/uL (ref 0.0–0.2)
Basos: 1 %
EOS (ABSOLUTE): 0.4 10*3/uL (ref 0.0–0.4)
Eos: 6 %
Hematocrit: 44.1 % (ref 34.0–46.6)
Hemoglobin: 14.8 g/dL (ref 11.1–15.9)
Immature Grans (Abs): 0 10*3/uL (ref 0.0–0.1)
Immature Granulocytes: 0 %
Lymphocytes Absolute: 2.4 10*3/uL (ref 0.7–3.1)
Lymphs: 36 %
MCH: 30.3 pg (ref 26.6–33.0)
MCHC: 33.6 g/dL (ref 31.5–35.7)
MCV: 90 fL (ref 79–97)
Monocytes Absolute: 0.5 10*3/uL (ref 0.1–0.9)
Monocytes: 7 %
Neutrophils Absolute: 3.4 10*3/uL (ref 1.4–7.0)
Neutrophils: 50 %
Platelets: 257 10*3/uL (ref 150–450)
RBC: 4.88 x10E6/uL (ref 3.77–5.28)
RDW: 12.8 % (ref 11.7–15.4)
WBC: 6.7 10*3/uL (ref 3.4–10.8)

## 2020-01-31 LAB — COMPREHENSIVE METABOLIC PANEL
ALT: 12 IU/L (ref 0–32)
AST: 14 IU/L (ref 0–40)
Albumin/Globulin Ratio: 1.8 (ref 1.2–2.2)
Albumin: 4.3 g/dL (ref 3.8–4.9)
Alkaline Phosphatase: 75 IU/L (ref 44–121)
BUN/Creatinine Ratio: 12 (ref 9–23)
BUN: 10 mg/dL (ref 6–24)
Bilirubin Total: 0.7 mg/dL (ref 0.0–1.2)
CO2: 27 mmol/L (ref 20–29)
Calcium: 9.3 mg/dL (ref 8.7–10.2)
Chloride: 104 mmol/L (ref 96–106)
Creatinine, Ser: 0.81 mg/dL (ref 0.57–1.00)
GFR calc Af Amer: 96 mL/min/{1.73_m2} (ref >59)
GFR calc non Af Amer: 83 mL/min/{1.73_m2} (ref >59)
Globulin, Total: 2.4 g/dL (ref 1.5–4.5)
Glucose: 90 mg/dL (ref 65–99)
Potassium: 4.3 mmol/L (ref 3.5–5.2)
Sodium: 142 mmol/L (ref 134–144)
Total Protein: 6.7 g/dL (ref 6.0–8.5)

## 2020-01-31 LAB — LIPID PANEL WITH LDL/HDL RATIO
Cholesterol, Total: 238 mg/dL — ABNORMAL HIGH (ref 100–199)
HDL: 66 mg/dL (ref >39)
LDL Chol Calc (NIH): 161 mg/dL — ABNORMAL HIGH (ref 0–99)
LDL/HDL Ratio: 2.4 ratio (ref 0.0–3.2)
Triglycerides: 67 mg/dL (ref 0–149)
VLDL Cholesterol Cal: 11 mg/dL (ref 5–40)

## 2020-01-31 LAB — TSH+FREE T4
Free T4: 1.32 ng/dL (ref 0.82–1.77)
TSH: 2.59 u[IU]/mL (ref 0.450–4.500)

## 2020-02-01 LAB — URINE CULTURE, REFLEX

## 2020-02-01 LAB — UA/M W/RFLX CULTURE, ROUTINE
Bilirubin, UA: NEGATIVE
Glucose, UA: NEGATIVE
Ketones, UA: NEGATIVE
Nitrite, UA: NEGATIVE
Protein,UA: NEGATIVE
RBC, UA: NEGATIVE
Specific Gravity, UA: <=1.005 — AB (ref 1.005–1.030)
Urobilinogen, Ur: 0.2 mg/dL (ref 0.2–1.0)
pH, UA: 7.5 (ref 5.0–7.5)

## 2020-02-01 LAB — MICROSCOPIC EXAMINATION
Bacteria, UA: NONE SEEN
Casts: NONE SEEN /lpf
RBC, Urine: NONE SEEN /hpf (ref 0–2)
WBC, UA: NONE SEEN /hpf (ref 0–5)

## 2020-02-03 NOTE — Progress Notes (Signed)
Please call patient and let her know lipid panel is abnormal. Encourage her to monitor diet and continue to daily exercise. Thanks!

## 2020-02-07 DIAGNOSIS — M7541 Impingement syndrome of right shoulder: Secondary | ICD-10-CM | POA: Diagnosis not present

## 2020-02-18 ENCOUNTER — Other Ambulatory Visit: Payer: Self-pay

## 2020-02-18 DIAGNOSIS — K219 Gastro-esophageal reflux disease without esophagitis: Secondary | ICD-10-CM

## 2020-02-18 MED ORDER — OMEPRAZOLE 20 MG PO CPDR: DELAYED_RELEASE_CAPSULE | ORAL | 0 refills | Status: DC

## 2020-02-18 NOTE — Telephone Encounter (Signed)
Pt called that her med accidentally send to her old address so send 30 day omeprazole to local phae

## 2020-02-19 ENCOUNTER — Other Ambulatory Visit: Payer: Federal, State, Local not specified - PPO

## 2020-02-19 ENCOUNTER — Other Ambulatory Visit: Payer: Self-pay

## 2020-02-19 DIAGNOSIS — Z20822 Contact with and (suspected) exposure to covid-19: Secondary | ICD-10-CM

## 2020-02-21 LAB — SARS-COV-2, NAA 2 DAY TAT

## 2020-02-21 LAB — NOVEL CORONAVIRUS, NAA: SARS-CoV-2, NAA: DETECTED — AB

## 2020-02-22 ENCOUNTER — Telehealth: Payer: Self-pay

## 2020-02-22 NOTE — Telephone Encounter (Signed)
Called to discuss with patient about COVID-19 symptoms and the use of one of the available treatments for those with mild to moderate Covid symptoms and at a high risk of hospitalization.  Pt appears to qualify for outpatient treatment due to co-morbid conditions and/or a member of an at-risk group in accordance with the FDA Emergency Use Authorization.    Symptom onset: Did not answer Vaccinated: Yes Booster? Yes Immunocompromised? No Qualifiers: No  Unable to reach pt - Reached pt.  Diana Black [name]  Pt. Declines ant further treatment.

## 2020-03-04 ENCOUNTER — Other Ambulatory Visit: Payer: Self-pay

## 2020-03-04 ENCOUNTER — Ambulatory Visit: Payer: Federal, State, Local not specified - PPO | Admitting: Podiatry

## 2020-03-04 DIAGNOSIS — L600 Ingrowing nail: Secondary | ICD-10-CM

## 2020-03-04 NOTE — Progress Notes (Signed)
   Subjective: Patient presents today for evaluation of pain to the medial border right great toe. Patient is concerned for possible ingrown nail. Patient presents today for further treatment and evaluation.  Past Medical History:  Diagnosis Date  . Environmental allergies   . GERD (gastroesophageal reflux disease)     Objective:  General: Well developed, nourished, in no acute distress, alert and oriented x3   Dermatology: Skin is warm, dry and supple bilateral.  Medial border right great toe appears to be erythematous with evidence of an ingrowing nail. Pain on palpation noted to the border of the nail fold. The remaining nails appear unremarkable at this time. There are no open sores, lesions.  Vascular: Dorsalis Pedis artery and Posterior Tibial artery pedal pulses palpable. No lower extremity edema noted.   Neruologic: Grossly intact via light touch bilateral.  Musculoskeletal: Muscular strength within normal limits in all groups bilateral. Normal range of motion noted to all pedal and ankle joints.   Assesement: #1 Paronychia with ingrowing nail medial border right great toe #2 Pain in toe #3 Incurvated nail  Plan of Care:  1. Patient evaluated.  2.  Today we discussed nail matricectomy versus simple debridement of the offending border of the nail plate.  The patient is leaving on a mission trip to [place] possibly within the next week or 2.  Today we will opt for conservative treatment. 3.  Debridement of the nail was performed and the offending border of the nail plate was removed.  Any subungual debris and callus tissue around the area was also debrided using a tissue nipper. 4.  Light dressing applied.  The patient noticed immediate relief after debridement and cleaning the area 5.  Return to clinic as needed  Felecia Shelling, DPM [facility]  Dr. Felecia Shelling, DPM    [address] [place], [place] [address]                 Office [phone]  Fax [phone]

## 2020-03-16 ENCOUNTER — Other Ambulatory Visit: Payer: Self-pay | Admitting: Hospice and Palliative Medicine

## 2020-03-16 DIAGNOSIS — K219 Gastro-esophageal reflux disease without esophagitis: Secondary | ICD-10-CM

## 2020-03-17 ENCOUNTER — Telehealth: Payer: Self-pay

## 2020-03-17 NOTE — Telephone Encounter (Signed)
Ok thank u

## 2020-03-17 NOTE — Telephone Encounter (Signed)
-----   Message from Lyndon Code, MD sent at 03/16/2020  8:24 AM EST ----- Routine follow up in 6 months is needed

## 2020-03-27 ENCOUNTER — Encounter: Payer: Self-pay | Admitting: Nurse Practitioner

## 2020-03-31 ENCOUNTER — Ambulatory Visit: Payer: Federal, State, Local not specified - PPO | Admitting: Hospice and Palliative Medicine

## 2020-03-31 ENCOUNTER — Encounter: Payer: Self-pay | Admitting: Hospice and Palliative Medicine

## 2020-03-31 ENCOUNTER — Other Ambulatory Visit: Payer: Self-pay

## 2020-03-31 VITALS — BP 132/86 | HR 66 | Temp 96.1°F | Resp 16 | Ht 63.5 in | Wt 185.4 lb

## 2020-03-31 DIAGNOSIS — E782 Mixed hyperlipidemia: Secondary | ICD-10-CM | POA: Diagnosis not present

## 2020-03-31 DIAGNOSIS — Z6832 Body mass index (BMI) 32.0-32.9, adult: Secondary | ICD-10-CM

## 2020-03-31 DIAGNOSIS — Z8616 Personal history of COVID-19: Secondary | ICD-10-CM | POA: Diagnosis not present

## 2020-03-31 NOTE — Progress Notes (Signed)
[facility] [address] [place], [place] [address]  Internal MEDICINE  Office Visit Note  Patient Name: Diana Black  [id number]  [id number]  Date of Service: 03/31/2020  Chief Complaint  Patient presents with  . Follow-up    Pt will be traveling internationally, previous test was positive in January 6th, does pt need to be retested or can she have a note documenting recovery?   . Gastroesophageal Reflux    HPI Patient is here for routine follow-up Tested positive for COVID January 6th--symptoms at this time have resolved, denies cough, shortness of breath or fevers Planned to travel back to [place] for extended living earlier this month but was denied flight due to still testing positive for COVID Has been asymptomatic for COVID symptoms since the middle of January Scheduled to be retested by airlines on Wednesday Reviewed recent labs with her--abnormal lipid panel   Current Medication: Outpatient Encounter Medications as of 03/31/2020  Medication Sig  . betamethasone valerate (VALISONE) 0.1 % cream betamethasone valerate 0.1 % topical cream  APPLY THIN LAYER TOPICALLY TO THE AFFECTED AREA EVERY DAY  . fluticasone (FLONASE) 50 MCG/ACT nasal spray Place into both nostrils daily.  . hepatitis A virus, PF, vaccine (HAVRIX, PF,) 1440 EL U/ML injection Inject 48ml IM one time >2week prior to expected HAV exposure. Second dose should be given IM in 6 to 12 months.  . latanoprost (XALATAN) 0.005 % ophthalmic solution latanoprost 0.005 % eye drops  . omeprazole (PRILOSEC) 20 MG capsule TAKE 1 CAPSULE(20 MG) BY MOUTH DAILY   No facility-administered encounter medications on file as of 03/31/2020.    Surgical History: Past Surgical History:  Procedure Laterality Date  . CESAREAN SECTION      Medical History: Past Medical History:  Diagnosis Date  . Environmental allergies   . GERD (gastroesophageal reflux disease)     Family History: Family History   Problem Relation Age of Onset  . Diabetes Mother   . Bladder Cancer Mother   . Memory loss Mother   . Stroke Father     Social History   Socioeconomic History  . Marital status: Married    Spouse name: Not on file  . Number of children: Not on file  . Years of education: Not on file  . Highest education level: Not on file  Occupational History  . Not on file  Tobacco Use  . Smoking status: Never Smoker  . Smokeless tobacco: Never Used  Vaping Use  . Vaping Use: Never used  Substance and Sexual Activity  . Alcohol use: Not Currently  . Drug use: Never  . Sexual activity: Yes  Other Topics Concern  . Not on file  Social History Narrative  . Not on file   Social Determinants of Health   Financial Resource Strain: Not on file  Food Insecurity: Not on file  Transportation Needs: Not on file  Physical Activity: Not on file  Stress: Not on file  Social Connections: Not on file  Intimate Partner Violence: Not on file      Review of Systems  Constitutional: Negative for chills, diaphoresis and fatigue.  HENT: Negative for ear pain, postnasal drip and sinus pressure.   Eyes: Negative for photophobia, discharge, redness, itching and visual disturbance.  Respiratory: Negative for cough, shortness of breath and wheezing.   Cardiovascular: Negative for chest pain, palpitations and leg swelling.  Gastrointestinal: Negative for abdominal pain, constipation, diarrhea, nausea and vomiting.  Genitourinary: Negative for dysuria and flank pain.  Musculoskeletal: Negative for arthralgias, back pain, gait problem and neck pain.  Skin: Negative for color change.  Allergic/Immunologic: Negative for environmental allergies and food allergies.  Neurological: Negative for dizziness and headaches.  Hematological: Does not bruise/bleed easily.  Psychiatric/Behavioral: Negative for agitation, behavioral problems (depression) and hallucinations.    Vital Signs: BP 132/86   Pulse 66    Temp (!) 96.1 F (35.6 C)   Resp 16   Ht 5' 3.5" (1.613 m)   Wt 185 lb 6.4 oz (84.1 kg)   SpO2 99%   BMI 32.33 kg/m    Physical Exam Vitals reviewed.  Constitutional:      Appearance: Normal appearance. She is obese.  Cardiovascular:     Rate and Rhythm: Normal rate and regular rhythm.     Pulses: Normal pulses.     Heart sounds: Normal heart sounds.  Pulmonary:     Effort: Pulmonary effort is normal.     Breath sounds: Normal breath sounds.  Abdominal:     General: Abdomen is flat.     Palpations: Abdomen is soft.  Musculoskeletal:        General: Normal range of motion.     Cervical back: Normal range of motion.  Skin:    General: Skin is warm.  Neurological:     General: No focal deficit present.     Mental Status: She is alert and oriented to person, place, and time. Mental status is at baseline.  Psychiatric:        Mood and Affect: Mood normal.        Behavior: Behavior normal.        Thought Content: Thought content normal.        Judgment: Judgment normal.    Assessment/Plan: 1. Personal history of COVID-19 Has fully recovered at this time--no lingering symptoms Note provided for medical clearance to give to airline  2. Mixed hyperlipidemia Discussed food and meal options to help lower cholesterol levels and foods to indulge in moderation Will discuss further vascular studies when she returns to the states next year  3. BMI 32.0-32.9,adult Obesity Counseling: Risk Assessment: An assessment of behavioral risk factors was made today and includes lack of exercise sedentary lifestyle, lack of portion control and poor dietary habits.  Risk Modification Advice: She was counseled on portion control guidelines. Restricting daily caloric intake to 1800. The detrimental long term effects of obesity on her health and ongoing poor compliance was also discussed with the patient.  General Counseling: elyssia strausser understanding of the findings of todays visit and  agrees with plan of treatment. I have discussed any further diagnostic evaluation that may be needed or ordered today. We also reviewed her medications today. she has been encouraged to call the office with any questions or concerns that should arise related to todays visit.   Time spent: 30 Minutes   This patient was seen by Leeanne Deed AGNP-C in Collaboration with Dr Lyndon Code as a part of collaborative care agreement     Lubertha Basque. Lorree Millar AGNP-C Internal medicine

## 2020-04-07 ENCOUNTER — Ambulatory Visit: Payer: Self-pay | Admitting: Dermatology

## 2020-07-21 ENCOUNTER — Ambulatory Visit: Payer: Self-pay | Admitting: Dermatology

## 2020-10-01 ENCOUNTER — Ambulatory Visit: Payer: Federal, State, Local not specified - PPO | Admitting: Dermatology

## 2021-01-28 ENCOUNTER — Encounter: Payer: Federal, State, Local not specified - PPO | Admitting: Hospice and Palliative Medicine

## 2021-03-25 ENCOUNTER — Other Ambulatory Visit: Payer: Self-pay

## 2021-03-25 DIAGNOSIS — K219 Gastro-esophageal reflux disease without esophagitis: Secondary | ICD-10-CM

## 2021-03-25 MED ORDER — OMEPRAZOLE 20 MG PO CPDR: DELAYED_RELEASE_CAPSULE | ORAL | 1 refills | Status: DC

## 2021-03-25 NOTE — Telephone Encounter (Signed)
Pt called that she was in out of country for [place] years she want her med send to [facility] for omeprazole and they mail it to her send pres and make her appt in 05/2022

## 2021-03-30 ENCOUNTER — Encounter: Payer: Federal, State, Local not specified - PPO | Admitting: Nurse Practitioner

## 2021-03-30 ENCOUNTER — Encounter: Payer: Federal, State, Local not specified - PPO | Admitting: Hospice and Palliative Medicine

## 2021-06-30 ENCOUNTER — Encounter: Payer: Self-pay | Admitting: Internal Medicine

## 2021-06-30 ENCOUNTER — Ambulatory Visit (INDEPENDENT_AMBULATORY_CARE_PROVIDER_SITE_OTHER): Payer: Federal, State, Local not specified - PPO | Admitting: Internal Medicine

## 2021-06-30 DIAGNOSIS — Z1211 Encounter for screening for malignant neoplasm of colon: Secondary | ICD-10-CM | POA: Diagnosis not present

## 2021-06-30 DIAGNOSIS — R300 Dysuria: Secondary | ICD-10-CM | POA: Diagnosis not present

## 2021-06-30 DIAGNOSIS — M722 Plantar fascial fibromatosis: Secondary | ICD-10-CM | POA: Diagnosis not present

## 2021-06-30 DIAGNOSIS — Z0001 Encounter for general adult medical examination with abnormal findings: Secondary | ICD-10-CM

## 2021-06-30 DIAGNOSIS — Z6833 Body mass index (BMI) 33.0-33.9, adult: Secondary | ICD-10-CM | POA: Diagnosis not present

## 2021-06-30 DIAGNOSIS — Z6834 Body mass index (BMI) 34.0-34.9, adult: Secondary | ICD-10-CM | POA: Diagnosis not present

## 2021-06-30 DIAGNOSIS — Z1231 Encounter for screening mammogram for malignant neoplasm of breast: Secondary | ICD-10-CM | POA: Diagnosis not present

## 2021-06-30 DIAGNOSIS — E782 Mixed hyperlipidemia: Secondary | ICD-10-CM

## 2021-06-30 DIAGNOSIS — Z01419 Encounter for gynecological examination (general) (routine) without abnormal findings: Secondary | ICD-10-CM | POA: Diagnosis not present

## 2021-06-30 NOTE — Progress Notes (Signed)
[facility] [place], [place] [address]  Internal MEDICINE  Office Visit Note  Patient Name: Diana Black  [id number]  [id number]  Date of Service: 07/10/2021  Chief Complaint  Patient presents with   Annual Exam   Gastroesophageal Reflux   Plantar Fasciitis    Specifically in left foot   Hyperlipidemia    Tear drop shaped fatty deposits in tear duct - Pt was told this could possibly be a sign of high cholesterol     HPI Pt is here for routine health maintenance examination [place] and lives there most of the year. Interested in getting her preventive health maintenance. She is complaining of left foot pain since she has been walking a little harder surfaces however since then she has been trying to avoid it and is wearing better supportive shoes. Patient also thinks that her cholesterol will be better she wants to try lifestyle modifications at the moment Current Medication: Outpatient Encounter Medications as of 06/30/2021  Medication Sig   latanoprost (XALATAN) 0.005 % ophthalmic solution latanoprost 0.005 % eye drops   omeprazole (PRILOSEC) 20 MG capsule Take 1 tab po daily   [DISCONTINUED] betamethasone valerate (VALISONE) 0.1 % cream betamethasone valerate 0.1 % topical cream  APPLY THIN LAYER TOPICALLY TO THE AFFECTED AREA EVERY DAY   [DISCONTINUED] fluticasone (FLONASE) 50 MCG/ACT nasal spray Place into both nostrils daily.   [DISCONTINUED] hepatitis A virus, PF, vaccine (HAVRIX, PF,) 1440 EL U/ML injection Inject 68m IM one time >2week prior to expected HAV exposure. Second dose should be given IM in 6 to 12 months.   No facility-administered encounter medications on file as of 06/30/2021.    Surgical History: Past Surgical History:  Procedure Laterality Date   CESAREAN SECTION      Medical History: Past Medical History:  Diagnosis Date   Environmental allergies    GERD (gastroesophageal reflux  disease)     Family History: Family History  Problem Relation Age of Onset   Diabetes Mother    Bladder Cancer Mother    Memory loss Mother    Stroke Father     Social History: Social History   Socioeconomic History   Marital status: Married    Spouse name: Not on file   Number of children: Not on file   Years of education: Not on file   Highest education level: Not on file  Occupational History   Not on file  Tobacco Use   Smoking status: Never   Smokeless tobacco: Never  Vaping Use   Vaping Use: Never used  Substance and Sexual Activity   Alcohol use: Not Currently   Drug use: Never   Sexual activity: Yes  Other Topics Concern   Not on file  Social History Narrative   Not on file   Social Determinants of Health   Financial Resource Strain: Not on file  Food Insecurity: Not on file  Transportation Needs: Not on file  Physical Activity: Not on file  Stress: Not on file  Social Connections: Not on file      Review of Systems  Constitutional:  Negative for chills, diaphoresis and fatigue.  HENT:  Negative for ear pain, postnasal drip and sinus pressure.   Eyes:  Negative for photophobia, discharge, redness, itching and visual disturbance.  Respiratory:  Negative for cough, shortness of breath and wheezing.   Cardiovascular:  Negative for chest pain, palpitations and leg swelling.  Gastrointestinal:  Negative for abdominal  pain, constipation, diarrhea, nausea and vomiting.  Genitourinary:  Negative for dysuria and flank pain.  Musculoskeletal:  Negative for arthralgias, back pain, gait problem and neck pain.  Skin:  Negative for color change.  Allergic/Immunologic: Negative for environmental allergies and food allergies.  Neurological:  Negative for dizziness and headaches.  Hematological:  Does not bruise/bleed easily.  Psychiatric/Behavioral:  Negative for agitation, behavioral problems (depression) and hallucinations.     Vital Signs: BP 108/70    Pulse 63   Temp 98 F (36.7 C)   Resp 16   Ht _0  (1.6 m)   Wt 192 lb 3.2 oz (87.2 kg)   LMP  (LMP Unknown)   SpO2 99%   BMI 34.05 kg/m    Physical Exam Constitutional:      General: She is not in acute distress.    Appearance: She is well-developed. She is not diaphoretic.  HENT:     Head: Normocephalic and atraumatic.     Mouth/Throat:     Pharynx: No oropharyngeal exudate.  Eyes:     Pupils: Pupils are equal, round, and reactive to light.  Neck:     Thyroid: No thyromegaly.     Vascular: No JVD.     Trachea: No tracheal deviation.  Cardiovascular:     Rate and Rhythm: Normal rate and regular rhythm.     Heart sounds: Normal heart sounds. No murmur heard.   No friction rub. No gallop.  Pulmonary:     Effort: Pulmonary effort is normal. No respiratory distress.     Breath sounds: No wheezing or rales.  Chest:     Chest wall: No tenderness.  Abdominal:     General: Bowel sounds are normal.     Palpations: Abdomen is soft.  Musculoskeletal:        General: Normal range of motion.     Cervical back: Normal range of motion and neck supple.  Lymphadenopathy:     Cervical: No cervical adenopathy.  Skin:    General: Skin is warm and dry.  Neurological:     Mental Status: She is alert and oriented to person, place, and time.     Cranial Nerves: No cranial nerve deficit.  Psychiatric:        Behavior: Behavior normal.        Thought Content: Thought content normal.        Judgment: Judgment normal.     LABS: Recent Results (from the past 2160 hour(s))  Comprehensive metabolic panel     Status: None   Collection Time: 06/30/21 10:27 AM  Result Value Ref Range   Glucose 85 70 - 99 mg/dL   BUN 14 6 - 24 mg/dL   Creatinine, Ser 0.83 0.57 - 1.00 mg/dL   eGFR 83 >59 mL/min/1.73   BUN/Creatinine Ratio 17 9 - 23   Sodium 140 134 - 144 mmol/L   Potassium 5.0 3.5 - 5.2 mmol/L   Chloride 102 96 - 106 mmol/L   CO2 25 20 - 29 mmol/L   Calcium 9.5 8.7 - 10.2 mg/dL    Total Protein 6.8 6.0 - 8.5 g/dL   Albumin 4.5 3.8 - 4.9 g/dL   Globulin, Total 2.3 1.5 - 4.5 g/dL   Albumin/Globulin Ratio 2.0 1.2 - 2.2   Bilirubin Total 0.5 0.0 - 1.2 mg/dL   Alkaline Phosphatase 112 44 - 121 IU/L   AST 17 0 - 40 IU/L   ALT 15 0 - 32 IU/L  UA/M w/rflx Culture, Routine  Status: None   Collection Time: 06/30/21 11:13 AM   Specimen: Urine   Urine  Result Value Ref Range   Specific Gravity, UA 1.006 1.005 - 1.030   pH, UA 7.5 5.0 - 7.5   Color, UA Yellow Yellow   Appearance Ur Clear Clear   Leukocytes,UA Negative Negative   Protein,UA Negative Negative/Trace   Glucose, UA Negative Negative   Ketones, UA Negative Negative   RBC, UA Negative Negative   Bilirubin, UA Negative Negative   Urobilinogen, Ur 0.2 0.2 - 1.0 mg/dL   Nitrite, UA Negative Negative   Microscopic Examination Comment     Comment: Microscopic follows if indicated.   Microscopic Examination See below:     Comment: Microscopic was indicated and was performed.   Urinalysis Reflex Comment     Comment: This specimen will not reflex to a Urine Culture.  Microscopic Examination     Status: None   Collection Time: 06/30/21 11:13 AM   Urine  Result Value Ref Range   WBC, UA None seen 0 - 5 /hpf   RBC None seen 0 - 2 /hpf   Epithelial Cells (non renal) None seen 0 - 10 /hpf   Casts None seen None seen /lpf   Bacteria, UA None seen None seen/Few      Assessment/Plan: 1. Encounter for general adult medical examination with abnormal findings We will update all her preventive health maintenance she will see OB/GYN for breast mammogram schedule colonoscopy Will need bone density as well - CBC with Differential/Platelet; Future - Lipid Panel With LDL/HDL Ratio; Future - TSH; Future - T4, free; Future - Comprehensive metabolic panel  2. Encounter for screening colonoscopy GI referral is made for colonoscopy  3. Mixed hyperlipidemia Patient is going to make some lifestyle changes including  low-fat cholesterol diet and will consider statin in future  4. Plantar fasciitis Patient is instructed to try Voltaren over-the-counter better support or insert and might need to see podiatry.   5. BMI 34 Obesity Counseling: Risk Assessment: An assessment of behavioral risk factors was made today and includes lack of exercise sedentary lifestyle, lack of portion control and poor dietary habits.  Risk Modification Advice: She was counseled on portion control guidelines. Restricting daily caloric intake to 1500. The detrimental long term effects of obesity on her health and ongoing poor compliance was also discussed with the patient.   Urine is collected as part of her CPE - UA/M w/rflx Culture, Routine   General Counseling: Anndrea verbalizes understanding of the findings of todays visit and agrees with plan of treatment. I have discussed any further diagnostic evaluation that may be needed or ordered today. We also reviewed her medications today. she has been encouraged to call the office with any questions or concerns that should arise related to todays visit.    Counseling:  [place] Controlled Substance Database was reviewed by me.  Orders Placed This Encounter  Procedures   Microscopic Examination   UA/M w/rflx Culture, Routine   CBC with Differential/Platelet   Lipid Panel With LDL/HDL Ratio   TSH   T4, free   Comprehensive metabolic panel   Ambulatory referral to Gastroenterology    No orders of the defined types were placed in this encounter.   Total time spent:45 Minutes  Time spent includes review of chart, medications, test results, and follow up plan with the patient.     Lavera Guise, MD  Internal Medicine

## 2021-07-01 ENCOUNTER — Telehealth: Payer: Self-pay

## 2021-07-01 LAB — COMPREHENSIVE METABOLIC PANEL
ALT: 15 IU/L (ref 0–32)
AST: 17 IU/L (ref 0–40)
Albumin/Globulin Ratio: 2 (ref 1.2–2.2)
Albumin: 4.5 g/dL (ref 3.8–4.9)
Alkaline Phosphatase: 112 IU/L (ref 44–121)
BUN/Creatinine Ratio: 17 (ref 9–23)
BUN: 14 mg/dL (ref 6–24)
Bilirubin Total: 0.5 mg/dL (ref 0.0–1.2)
CO2: 25 mmol/L (ref 20–29)
Calcium: 9.5 mg/dL (ref 8.7–10.2)
Chloride: 102 mmol/L (ref 96–106)
Creatinine, Ser: 0.83 mg/dL (ref 0.57–1.00)
Globulin, Total: 2.3 g/dL (ref 1.5–4.5)
Glucose: 85 mg/dL (ref 70–99)
Potassium: 5 mmol/L (ref 3.5–5.2)
Sodium: 140 mmol/L (ref 134–144)
Total Protein: 6.8 g/dL (ref 6.0–8.5)
eGFR: 83 mL/min/{1.73_m2} (ref >59)

## 2021-07-01 LAB — UA/M W/RFLX CULTURE, ROUTINE
Bilirubin, UA: NEGATIVE
Glucose, UA: NEGATIVE
Ketones, UA: NEGATIVE
Leukocytes,UA: NEGATIVE
Nitrite, UA: NEGATIVE
Protein,UA: NEGATIVE
RBC, UA: NEGATIVE
Specific Gravity, UA: 1.006 (ref 1.005–1.030)
Urobilinogen, Ur: 0.2 mg/dL (ref 0.2–1.0)
pH, UA: 7.5 (ref 5.0–7.5)

## 2021-07-01 LAB — MICROSCOPIC EXAMINATION
Bacteria, UA: NONE SEEN
Casts: NONE SEEN /lpf
Epithelial Cells (non renal): NONE SEEN /hpf (ref 0–10)
RBC, Urine: NONE SEEN /hpf (ref 0–2)
WBC, UA: NONE SEEN /hpf (ref 0–5)

## 2021-07-01 NOTE — Telephone Encounter (Signed)
CLOSED REFERRAL PATIENT LIVES IN [place] AND WILL COME BACK IN December AND CALL us THEN TO SCHEDULE PROCEDURE

## 2021-07-09 ENCOUNTER — Telehealth: Payer: Self-pay

## 2021-07-11 NOTE — Telephone Encounter (Signed)
Please check with me

## 2021-07-13 ENCOUNTER — Other Ambulatory Visit: Payer: Self-pay

## 2021-07-13 DIAGNOSIS — M722 Plantar fascial fibromatosis: Secondary | ICD-10-CM

## 2021-07-13 DIAGNOSIS — Z0001 Encounter for general adult medical examination with abnormal findings: Secondary | ICD-10-CM

## 2021-07-13 DIAGNOSIS — R300 Dysuria: Secondary | ICD-10-CM

## 2021-07-13 DIAGNOSIS — K219 Gastro-esophageal reflux disease without esophagitis: Secondary | ICD-10-CM

## 2021-07-13 DIAGNOSIS — E782 Mixed hyperlipidemia: Secondary | ICD-10-CM

## 2021-07-13 DIAGNOSIS — Z1211 Encounter for screening for malignant neoplasm of colon: Secondary | ICD-10-CM

## 2021-07-13 MED ORDER — OMEPRAZOLE 20 MG PO CPDR: DELAYED_RELEASE_CAPSULE | ORAL | 3 refills | Status: DC

## 2021-07-13 MED ORDER — OMEPRAZOLE 20 MG PO CPDR: DELAYED_RELEASE_CAPSULE | ORAL | 1 refills | Status: DC

## 2021-07-13 NOTE — Telephone Encounter (Signed)
Lmom that if she can call us back

## 2021-07-13 NOTE — Telephone Encounter (Signed)
Lmom to call us back 

## 2021-07-15 NOTE — Telephone Encounter (Signed)
Spoke with she like to wait for crestor for now advised her follow diet for low fat,low chol and exercise and if she can do lab over there send [place] mychart message and omeprazole already send to [facility]

## 2021-09-25 ENCOUNTER — Telehealth: Payer: Self-pay

## 2021-09-28 ENCOUNTER — Other Ambulatory Visit: Payer: Self-pay | Admitting: Internal Medicine

## 2021-09-28 ENCOUNTER — Other Ambulatory Visit: Payer: Self-pay

## 2021-09-28 MED ORDER — FAMOTIDINE 20 MG PO TABS: 20.0000 mg | ORAL_TABLET | Freq: Two times a day (BID) | ORAL | 3 refills | Status: DC

## 2021-09-28 NOTE — Telephone Encounter (Signed)
Pt advised that dr Welton Flakes change med to famotidine

## 2022-05-24 ENCOUNTER — Encounter: Payer: Federal, State, Local not specified - PPO | Admitting: Nurse Practitioner

## 2022-06-23 DIAGNOSIS — H401131 Primary open-angle glaucoma, bilateral, mild stage: Secondary | ICD-10-CM | POA: Diagnosis not present

## 2022-06-23 DIAGNOSIS — K080 Exfoliation of teeth due to systemic causes: Secondary | ICD-10-CM | POA: Diagnosis not present

## 2022-06-28 DIAGNOSIS — Z124 Encounter for screening for malignant neoplasm of cervix: Secondary | ICD-10-CM | POA: Diagnosis not present

## 2022-06-28 DIAGNOSIS — Z01419 Encounter for gynecological examination (general) (routine) without abnormal findings: Secondary | ICD-10-CM | POA: Diagnosis not present

## 2022-06-28 DIAGNOSIS — Z1231 Encounter for screening mammogram for malignant neoplasm of breast: Secondary | ICD-10-CM | POA: Diagnosis not present

## 2022-06-29 DIAGNOSIS — H401131 Primary open-angle glaucoma, bilateral, mild stage: Secondary | ICD-10-CM | POA: Diagnosis not present

## 2022-07-01 ENCOUNTER — Encounter: Payer: Self-pay | Admitting: Nurse Practitioner

## 2022-07-01 ENCOUNTER — Ambulatory Visit (INDEPENDENT_AMBULATORY_CARE_PROVIDER_SITE_OTHER): Payer: Federal, State, Local not specified - PPO | Admitting: Nurse Practitioner

## 2022-07-01 VITALS — BP 120/72 | HR 64 | Temp 98.3°F | Resp 16 | Ht 64.0 in | Wt 191.4 lb

## 2022-07-01 DIAGNOSIS — Z0001 Encounter for general adult medical examination with abnormal findings: Secondary | ICD-10-CM

## 2022-07-01 DIAGNOSIS — R300 Dysuria: Secondary | ICD-10-CM | POA: Diagnosis not present

## 2022-07-01 DIAGNOSIS — K219 Gastro-esophageal reflux disease without esophagitis: Secondary | ICD-10-CM | POA: Diagnosis not present

## 2022-07-01 DIAGNOSIS — E782 Mixed hyperlipidemia: Secondary | ICD-10-CM | POA: Diagnosis not present

## 2022-07-01 DIAGNOSIS — Z1211 Encounter for screening for malignant neoplasm of colon: Secondary | ICD-10-CM

## 2022-07-01 DIAGNOSIS — Z1212 Encounter for screening for malignant neoplasm of rectum: Secondary | ICD-10-CM

## 2022-07-01 DIAGNOSIS — M722 Plantar fascial fibromatosis: Secondary | ICD-10-CM | POA: Diagnosis not present

## 2022-07-01 NOTE — Progress Notes (Signed)
[facility] [address] [place], [place] [address]  Internal MEDICINE  Office Visit Note  Patient Name: Diana Black  [id number]  [id number]  Date of Service: 07/01/2022  Chief Complaint  Patient presents with   Gastroesophageal Reflux   Annual Exam    HPI Donnett presents for an annual well visit and physical exam.  Well-appearing 57 y.o. female with GERD and plantar fasciitis Routine CRC screening: opt for routine colonoscopy, needs referral Routine mammogram: done on 06/28/22 Pap smear: had pap done on 06/28/22 Labs: due for routine labs  New or worsening pain: none, plantar fasciitis  Other concerns: none    Current Medication: Outpatient Encounter Medications as of 07/01/2022  Medication Sig   famotidine (PEPCID) 20 MG tablet Take 1 tablet (20 mg total) by mouth 2 (two) times daily.   latanoprost (XALATAN) 0.005 % ophthalmic solution latanoprost 0.005 % eye drops   No facility-administered encounter medications on file as of 07/01/2022.    Surgical History: Past Surgical History:  Procedure Laterality Date   CESAREAN SECTION      Medical History: Past Medical History:  Diagnosis Date   Environmental allergies    GERD (gastroesophageal reflux disease)     Family History: Family History  Problem Relation Age of Onset   Diabetes Mother    Bladder Cancer Mother    Memory loss Mother    Stroke Father     Social History   Socioeconomic History   Marital status: Married    Spouse name: Not on file   Number of children: Not on file   Years of education: Not on file   Highest education level: Not on file  Occupational History   Not on file  Tobacco Use   Smoking status: Never   Smokeless tobacco: Never  Vaping Use   Vaping Use: Never used  Substance and Sexual Activity   Alcohol use: Not Currently   Drug use: Never   Sexual activity: Yes  Other Topics Concern   Not on file  Social History Narrative   Not on file   Social  Determinants of Health   Financial Resource Strain: Not on file  Food Insecurity: Not on file  Transportation Needs: Not on file  Physical Activity: Not on file  Stress: Not on file  Social Connections: Not on file  Intimate Partner Violence: Not on file      Review of Systems  Constitutional:  Negative for activity change, appetite change, chills, fatigue, fever and unexpected weight change.  HENT: Negative.  Negative for congestion, ear pain, rhinorrhea, sore throat and trouble swallowing.   Eyes: Negative.   Respiratory: Negative.  Negative for cough, chest tightness, shortness of breath and wheezing.   Cardiovascular: Negative.  Negative for chest pain.  Gastrointestinal: Negative.  Negative for abdominal pain, blood in stool, constipation, diarrhea, nausea and vomiting.  Endocrine: Negative.   Genitourinary: Negative.  Negative for difficulty urinating, dysuria, frequency, hematuria and urgency.  Musculoskeletal: Negative.  Negative for arthralgias, back pain, joint swelling, myalgias and neck pain.  Skin: Negative.  Negative for rash and wound.  Allergic/Immunologic: Negative.  Negative for immunocompromised state.  Neurological: Negative.  Negative for dizziness, seizures, numbness and headaches.  Hematological: Negative.   Psychiatric/Behavioral: Negative.  Negative for behavioral problems, self-injury and suicidal ideas. The patient is not nervous/anxious.     Vital Signs: BP 120/72   Pulse 64   Temp 98.3 F (36.8 C)   Resp 16   Ht 5\' 4"  (  1.626 m)   Wt 191 lb 6.4 oz (86.8 kg)   LMP  (LMP Unknown)   SpO2 99%   BMI 32.85 kg/m    Physical Exam Vitals reviewed.  Constitutional:      General: She is not in acute distress.    Appearance: She is well-developed. She is not diaphoretic.  HENT:     Head: Normocephalic and atraumatic.     Right Ear: External ear normal.     Left Ear: External ear normal.     Nose: Nose normal.     Mouth/Throat:     Pharynx: No  oropharyngeal exudate.  Eyes:     General: No scleral icterus.       Right eye: No discharge.        Left eye: No discharge.     Conjunctiva/sclera: Conjunctivae normal.     Pupils: Pupils are equal, round, and reactive to light.  Neck:     Thyroid: No thyromegaly.     Vascular: No JVD.     Trachea: No tracheal deviation.  Cardiovascular:     Rate and Rhythm: Normal rate and regular rhythm.     Heart sounds: Normal heart sounds. No murmur heard.    No friction rub. No gallop.  Pulmonary:     Effort: Pulmonary effort is normal. No respiratory distress.     Breath sounds: Normal breath sounds. No stridor. No wheezing or rales.  Chest:     Chest wall: No tenderness.  Abdominal:     General: Bowel sounds are normal. There is no distension.     Palpations: Abdomen is soft. There is no mass.     Tenderness: There is no abdominal tenderness. There is no guarding or rebound.  Musculoskeletal:        General: No tenderness or deformity. Normal range of motion.     Cervical back: Normal range of motion and neck supple.  Lymphadenopathy:     Cervical: No cervical adenopathy.  Skin:    General: Skin is warm and dry.     Coloration: Skin is not pale.     Findings: No erythema or rash.  Neurological:     Mental Status: She is alert.     Cranial Nerves: No cranial nerve deficit.     Motor: No abnormal muscle tone.     Coordination: Coordination normal.     Deep Tendon Reflexes: Reflexes are normal and symmetric.  Psychiatric:        Behavior: Behavior normal.        Thought Content: Thought content normal.        Judgment: Judgment normal.        Assessment/Plan: 1. Encounter for routine adult health examination with abnormal findings Age-appropriate preventive screenings and vaccinations discussed, annual physical exam completed. Routine labs for health maintenance ordered, see below. PHM updated.  - CBC with Differential/Platelet - Lipid Profile - CMP14+EGFR - TSH + free  T4  2. Plantar fasciitis Continue stretches at home Routine labs ordered - CBC with Differential/Platelet - Lipid Profile - CMP14+EGFR - TSH + free T4  3. Gastroesophageal reflux disease without esophagitis Routine labs ordered - CBC with Differential/Platelet - Lipid Profile - CMP14+EGFR - TSH + free T4  4. Mixed hyperlipidemia Routine labs ordered - CBC with Differential/Platelet - Lipid Profile - CMP14+EGFR - TSH + free T4  5. Screening for colorectal cancer Referred to GI for routine colonoscopy - Ambulatory referral to Gastroenterology     General Counseling: Soyla Dryer  understanding of the findings of todays visit and agrees with plan of treatment. I have discussed any further diagnostic evaluation that may be needed or ordered today. We also reviewed her medications today. she has been encouraged to call the office with any questions or concerns that should arise related to todays visit.    Orders Placed This Encounter  Procedures   CBC with Differential/Platelet   Lipid Profile   CMP14+EGFR   TSH + free T4   Ambulatory referral to Gastroenterology    No orders of the defined types were placed in this encounter.   Return in about 1 year (around 07/01/2023) for CPE, Keirstan Iannello PCP and otherwise as needed .   Total time spent:30 Minutes Time spent includes review of chart, medications, test results, and follow up plan with the patient.   [place] Controlled Substance Database was reviewed by me.  This patient was seen by Sallyanne Kuster, FNP-C in collaboration with Dr. Beverely Risen as a part of collaborative care agreement.  Ladd Cen R. Tedd Sias, MSN, FNP-C Internal medicine

## 2022-07-01 NOTE — Addendum Note (Signed)
Addended by: Annamaria Helling on: 07/01/2022 04:09 PM   Modules accepted: Orders

## 2022-07-02 ENCOUNTER — Other Ambulatory Visit: Payer: Self-pay

## 2022-07-02 ENCOUNTER — Telehealth: Payer: Self-pay

## 2022-07-02 ENCOUNTER — Telehealth: Payer: Self-pay | Admitting: Nurse Practitioner

## 2022-07-02 DIAGNOSIS — K219 Gastro-esophageal reflux disease without esophagitis: Secondary | ICD-10-CM | POA: Diagnosis not present

## 2022-07-02 DIAGNOSIS — Z0001 Encounter for general adult medical examination with abnormal findings: Secondary | ICD-10-CM | POA: Diagnosis not present

## 2022-07-02 DIAGNOSIS — M722 Plantar fascial fibromatosis: Secondary | ICD-10-CM | POA: Diagnosis not present

## 2022-07-02 DIAGNOSIS — E782 Mixed hyperlipidemia: Secondary | ICD-10-CM | POA: Diagnosis not present

## 2022-07-02 LAB — UA/M W/RFLX CULTURE, ROUTINE
Bilirubin, UA: NEGATIVE
Glucose, UA: NEGATIVE
Ketones, UA: NEGATIVE
Leukocytes,UA: NEGATIVE
Nitrite, UA: NEGATIVE
Protein,UA: NEGATIVE
RBC, UA: NEGATIVE
Specific Gravity, UA: 1.007 (ref 1.005–1.030)
Urobilinogen, Ur: 0.2 mg/dL (ref 0.2–1.0)
pH, UA: 6 (ref 5.0–7.5)

## 2022-07-02 LAB — MICROSCOPIC EXAMINATION
Bacteria, UA: NONE SEEN
Casts: NONE SEEN /lpf
Epithelial Cells (non renal): NONE SEEN /hpf (ref 0–10)
RBC, Urine: NONE SEEN /hpf (ref 0–2)
WBC, UA: NONE SEEN /hpf (ref 0–5)

## 2022-07-02 MED ORDER — AZITHROMYCIN 250 MG PO TABS: ORAL_TABLET | ORAL | 0 refills | Status: DC

## 2022-07-02 NOTE — Telephone Encounter (Signed)
Pt called that she saw alyssa yesterday she still having allergies and sinus congestion as per alyssa send zpak and lmom that we send med

## 2022-07-02 NOTE — Telephone Encounter (Signed)
GI referral sent via Epic to [facility]

## 2022-07-03 LAB — CMP14+EGFR
ALT: 16 IU/L (ref 0–32)
AST: 15 IU/L (ref 0–40)
Albumin/Globulin Ratio: 1.5 (ref 1.2–2.2)
Albumin: 4.1 g/dL (ref 3.8–4.9)
Alkaline Phosphatase: 110 IU/L (ref 44–121)
BUN/Creatinine Ratio: 16 (ref 9–23)
BUN: 12 mg/dL (ref 6–24)
Bilirubin Total: 0.4 mg/dL (ref 0.0–1.2)
CO2: 24 mmol/L (ref 20–29)
Calcium: 8.9 mg/dL (ref 8.7–10.2)
Chloride: 104 mmol/L (ref 96–106)
Creatinine, Ser: 0.75 mg/dL (ref 0.57–1.00)
Globulin, Total: 2.7 g/dL (ref 1.5–4.5)
Glucose: 96 mg/dL (ref 70–99)
Potassium: 4.4 mmol/L (ref 3.5–5.2)
Sodium: 140 mmol/L (ref 134–144)
Total Protein: 6.8 g/dL (ref 6.0–8.5)
eGFR: 93 mL/min/{1.73_m2} (ref >59)

## 2022-07-03 LAB — CBC WITH DIFFERENTIAL/PLATELET
Basophils Absolute: 0.1 10*3/uL (ref 0.0–0.2)
Basos: 1 %
EOS (ABSOLUTE): 0.1 10*3/uL (ref 0.0–0.4)
Eos: 2 %
Hematocrit: 43.4 % (ref 34.0–46.6)
Hemoglobin: 14.2 g/dL (ref 11.1–15.9)
Immature Grans (Abs): 0 10*3/uL (ref 0.0–0.1)
Immature Granulocytes: 0 %
Lymphocytes Absolute: 2.3 10*3/uL (ref 0.7–3.1)
Lymphs: 32 %
MCH: 29.8 pg (ref 26.6–33.0)
MCHC: 32.7 g/dL (ref 31.5–35.7)
MCV: 91 fL (ref 79–97)
Monocytes Absolute: 0.5 10*3/uL (ref 0.1–0.9)
Monocytes: 7 %
Neutrophils Absolute: 4.3 10*3/uL (ref 1.4–7.0)
Neutrophils: 58 %
Platelets: 247 10*3/uL (ref 150–450)
RBC: 4.77 x10E6/uL (ref 3.77–5.28)
RDW: 12.9 % (ref 11.7–15.4)
WBC: 7.3 10*3/uL (ref 3.4–10.8)

## 2022-07-03 LAB — TSH+FREE T4
Free T4: 1.12 ng/dL (ref 0.82–1.77)
TSH: 2.02 u[IU]/mL (ref 0.450–4.500)

## 2022-07-03 LAB — LIPID PANEL
Chol/HDL Ratio: 3.6 ratio (ref 0.0–4.4)
Cholesterol, Total: 171 mg/dL (ref 100–199)
HDL: 48 mg/dL (ref >39)
LDL Chol Calc (NIH): 110 mg/dL — ABNORMAL HIGH (ref 0–99)
Triglycerides: 70 mg/dL (ref 0–149)
VLDL Cholesterol Cal: 13 mg/dL (ref 5–40)

## 2022-07-06 ENCOUNTER — Encounter: Payer: Federal, State, Local not specified - PPO | Admitting: Internal Medicine

## 2022-07-08 ENCOUNTER — Telehealth: Payer: Self-pay

## 2022-07-08 ENCOUNTER — Other Ambulatory Visit: Payer: Self-pay

## 2022-07-08 DIAGNOSIS — Z1211 Encounter for screening for malignant neoplasm of colon: Secondary | ICD-10-CM

## 2022-07-08 MED ORDER — NA SULFATE-K SULFATE-MG SULF 17.5-3.13-1.6 GM/177ML PO SOLN: 1.0000 | Freq: Once | ORAL | 0 refills | Status: AC

## 2022-07-08 NOTE — Telephone Encounter (Signed)
Gastroenterology Pre-Procedure Review  Request Date: 10/08/22 Requesting Physician: Dr. Allegra Lai  PATIENT REVIEW QUESTIONS: The patient responded to the following health history questions as indicated:    1. Are you having any GI issues? no 2. Do you have a personal history of Polyps? no 3. Do you have a family history of Colon Cancer or Polyps? no 4. Diabetes Mellitus? no 5. Joint replacements in the past 12 months?no 6. Major health problems in the past 3 months?no 7. Any artificial heart valves, MVP, or defibrillator?no    MEDICATIONS & ALLERGIES:    Patient reports the following regarding taking any anticoagulation/antiplatelet therapy:   Plavix, Coumadin, Eliquis, Xarelto, Lovenox, Pradaxa, Brilinta, or Effient? no Aspirin? no  Patient confirms/reports the following medications:  Current Outpatient Medications  Medication Sig Dispense Refill   azithromycin (ZITHROMAX) 250 MG tablet Use as directed for 5 day 6 each 0   famotidine (PEPCID) 20 MG tablet Take 1 tablet (20 mg total) by mouth 2 (two) times daily. 180 tablet 3   latanoprost (XALATAN) 0.005 % ophthalmic solution latanoprost 0.005 % eye drops     No current facility-administered medications for this visit.    Patient confirms/reports the following allergies:  No Known Allergies  No orders of the defined types were placed in this encounter.   AUTHORIZATION INFORMATION Primary Insurance: 1D#: Group #:  Secondary Insurance: 1D#: Group #:  SCHEDULE INFORMATION: Date: 10/08/22 Time: Location: [facility]

## 2022-07-08 NOTE — Telephone Encounter (Signed)
Pt left a vm returning your call to schedule her colonoscopy. Pt is looking to set this up after labor day. Please return call.

## 2022-08-09 NOTE — Progress Notes (Signed)
All labs are normal except slightly elevated LDL of 110 which is significantly improved from previous LDL of 161.  Continue to limit red meat, increase lean proteins and take a fish oil or flaxseed oil supplement if desired.

## 2022-08-10 ENCOUNTER — Telehealth: Payer: Self-pay

## 2022-08-10 NOTE — Progress Notes (Signed)
Lmom to call us back 

## 2022-08-10 NOTE — Telephone Encounter (Signed)
-----   Message from Sallyanne Kuster, NP sent at 08/09/2022  8:15 AM EDT ----- All labs are normal except slightly elevated LDL of 110 which is significantly improved from previous LDL of 161.  Continue to limit red meat, increase lean proteins and take a fish oil or flaxseed oil supplement if desired.

## 2022-08-10 NOTE — Telephone Encounter (Signed)
Pt notified labs result  

## 2022-10-06 DIAGNOSIS — H401131 Primary open-angle glaucoma, bilateral, mild stage: Secondary | ICD-10-CM | POA: Diagnosis not present

## 2022-10-07 ENCOUNTER — Encounter: Payer: Self-pay | Admitting: Gastroenterology

## 2022-10-08 ENCOUNTER — Encounter
Admission: RE | Disposition: A | Discharge status: Home / Self Care | Payer: Self-pay | Source: Home / Self Care | Attending: Gastroenterology

## 2022-10-08 ENCOUNTER — Ambulatory Visit: Payer: Federal, State, Local not specified - PPO | Admitting: Certified Registered Nurse Anesthetist

## 2022-10-08 ENCOUNTER — Encounter: Payer: Self-pay | Admitting: Gastroenterology

## 2022-10-08 ENCOUNTER — Ambulatory Visit
Admission: RE | Admit: 2022-10-08 | Discharge: 2022-10-08 | Disposition: A | Discharge status: Home / Self Care | Payer: Federal, State, Local not specified - PPO | Attending: Gastroenterology | Admitting: Gastroenterology

## 2022-10-08 ENCOUNTER — Other Ambulatory Visit: Payer: Self-pay

## 2022-10-08 DIAGNOSIS — Z1211 Encounter for screening for malignant neoplasm of colon: Secondary | ICD-10-CM

## 2022-10-08 HISTORY — PX: COLONOSCOPY WITH PROPOFOL: SHX5780

## 2022-10-08 SURGERY — COLONOSCOPY WITH PROPOFOL
Anesthesia: General

## 2022-10-08 MED ORDER — PROPOFOL 10 MG/ML IV BOLUS
INTRAVENOUS | Status: DC | PRN
  Administered 2022-10-08: 60 mg via INTRAVENOUS

## 2022-10-08 MED ORDER — PROPOFOL 500 MG/50ML IV EMUL
INTRAVENOUS | Status: DC | PRN
  Administered 2022-10-08: 160 ug/kg/min via INTRAVENOUS

## 2022-10-08 MED ORDER — GLYCOPYRROLATE 0.2 MG/ML IJ SOLN
INTRAMUSCULAR | Status: AC
  Filled 2022-10-08: qty 1

## 2022-10-08 MED ORDER — SODIUM CHLORIDE 0.9 % IV SOLN: INTRAVENOUS | Status: DC

## 2022-10-08 MED ORDER — LIDOCAINE HCL (PF) 2 % IJ SOLN
INTRAMUSCULAR | Status: AC
  Filled 2022-10-08: qty 5

## 2022-10-08 MED ORDER — PROPOFOL 1000 MG/100ML IV EMUL
INTRAVENOUS | Status: AC
  Filled 2022-10-08: qty 300

## 2022-10-08 NOTE — H&P (Signed)
  Arlyss Repress, MD [address]  [address]  [place], [place] [address]  Main: [phone]  Fax: [phone] Pager: [phone]  Primary Care Physician:  Sallyanne Kuster, NP Primary Gastroenterologist:  Dr. Arlyss Repress  Pre-Procedure History & Physical: HPI:  Diana Black is a 56 y.o. female is here for an colonoscopy.   Past Medical History:  Diagnosis Date   Environmental allergies    GERD (gastroesophageal reflux disease)     Past Surgical History:  Procedure Laterality Date   CESAREAN SECTION      Prior to Admission medications   Medication Sig Start Date End Date Taking? Authorizing Provider  latanoprost (XALATAN) 0.005 % ophthalmic solution latanoprost 0.005 % eye drops    [provider]    Allergies as of 07/08/2022   (No Known Allergies)    Family History  Problem Relation Age of Onset   Diabetes Mother    Bladder Cancer Mother    Memory loss Mother    Stroke Father     Social History   Socioeconomic History   Marital status: Married    Spouse name: Not on file   Number of children: Not on file   Years of education: Not on file   Highest education level: Not on file  Occupational History   Not on file  Tobacco Use   Smoking status: Never   Smokeless tobacco: Never  Vaping Use   Vaping status: Never Used  Substance and Sexual Activity   Alcohol use: Not Currently   Drug use: Never   Sexual activity: Yes  Other Topics Concern   Not on file  Social History Narrative   Not on file   Social Determinants of Health   Financial Resource Strain: Not on file  Food Insecurity: Not on file  Transportation Needs: Not on file  Physical Activity: Not on file  Stress: Not on file  Social Connections: Not on file  Intimate Partner Violence: Not on file    Review of Systems: See HPI, otherwise negative ROS  Physical Exam: BP 107/77   Pulse 62   Temp (!) 97.4 F (36.3 C) (Temporal)   Resp 16   Ht 5\' 4"  (1.626 m)    Wt 88.4 kg   LMP  (LMP Unknown)   SpO2 100%   BMI 33.44 kg/m  General:   Alert,  pleasant and cooperative in NAD Head:  Normocephalic and atraumatic. Neck:  Supple; no masses or thyromegaly. Lungs:  Clear throughout to auscultation.    Heart:  Regular rate and rhythm. Abdomen:  Soft, nontender and nondistended. Normal bowel sounds, without guarding, and without rebound.   Neurologic:  Alert and  oriented x4;  grossly normal neurologically.  Impression/Plan: Diana Black is here for an colonoscopy to be performed for colon cancer screening  Risks, benefits, limitations, and alternatives regarding  colonoscopy have been reviewed with the patient.  Questions have been answered.  All parties agreeable.   Lannette Donath, MD  10/08/2022, 7:17 AM

## 2022-10-08 NOTE — Anesthesia Preprocedure Evaluation (Signed)
Anesthesia Evaluation  Patient identified by MRN, date of birth, ID band Patient awake    Reviewed: Allergy & Precautions, H&P , NPO status , Patient's Chart, lab work & pertinent test results, reviewed documented beta blocker date and time   Airway Mallampati: II  TM Distance: >3 FB Neck ROM: full    Dental  (+) Dental Advidsory Given, Missing   Pulmonary neg pulmonary ROS, Continuous Positive Airway Pressure Ventilation    Pulmonary exam normal breath sounds clear to auscultation       Cardiovascular Exercise Tolerance: Good negative cardio ROS Normal cardiovascular exam Rhythm:regular Rate:Normal     Neuro/Psych negative neurological ROS  negative psych ROS   GI/Hepatic negative GI ROS, Neg liver ROS,,,  Endo/Other  negative endocrine ROS    Renal/GU negative Renal ROS  negative genitourinary   Musculoskeletal   Abdominal   Peds  Hematology negative hematology ROS (+)   Anesthesia Other Findings Past Medical History: No date: Environmental allergies No date: GERD (gastroesophageal reflux disease)   Reproductive/Obstetrics negative OB ROS                             Anesthesia Physical Anesthesia Plan  ASA: 2  Anesthesia Plan: General   Post-op Pain Management:    Induction: Intravenous  PONV Risk Score and Plan: 3 and Propofol infusion and TIVA  Airway Management Planned: Natural Airway and Nasal Cannula  Additional Equipment:   Intra-op Plan:   Post-operative Plan:   Informed Consent: I have reviewed the patients History and Physical, chart, labs and discussed the procedure including the risks, benefits and alternatives for the proposed anesthesia with the patient or authorized representative who has indicated his/her understanding and acceptance.     Dental Advisory Given  Plan Discussed with: Anesthesiologist, CRNA and Surgeon  Anesthesia Plan Comments:         Anesthesia Quick Evaluation

## 2022-10-08 NOTE — Op Note (Signed)
[facility] Gastroenterology Patient Name: Diana Black Procedure Date: 10/08/2022 7:17 AM MRN: [id number] Account #: [id number] Date of Birth: 12/18/1965 Admit Type: Outpatient Age: 58 Room: [facility] ENDO ROOM 2 Gender: Female Note Status: Finalized Instrument Name: Prentice Docker [id number] Procedure:             Colonoscopy Indications:           Screening for colorectal malignant neoplasm, This is                         the patient's first colonoscopy Providers:             Toney Reil MD, MD Referring MD:          Toney Reil MD, MD (Referring MD), Sallyanne Kuster (Referring MD) Medicines:             General Anesthesia Complications:         No immediate complications. Estimated blood loss: None. Procedure:             Pre-Anesthesia Assessment:                        - Prior to the procedure, a History and Physical was                         performed, and patient medications and allergies were                         reviewed. The patient is competent. The risks and                         benefits of the procedure and the sedation options and                         risks were discussed with the patient. All questions                         were answered and informed consent was obtained.                         Patient identification and proposed procedure were                         verified by the physician, the nurse, the                         anesthesiologist, the anesthetist and the technician                         in the pre-procedure area in the procedure room in the                         endoscopy suite. Mental Status Examination: alert and                         oriented. Airway Examination: normal oropharyngeal  airway and neck mobility. Respiratory Examination:                         clear to auscultation. CV Examination: normal.                         Prophylactic  Antibiotics: The patient does not require                         prophylactic antibiotics. Prior Anticoagulants: The                         patient has taken no anticoagulant or antiplatelet                         agents. ASA Grade Assessment: II - A patient with mild                         systemic disease. After reviewing the risks and                         benefits, the patient was deemed in satisfactory                         condition to undergo the procedure. The anesthesia                         plan was to use general anesthesia. Immediately prior                         to administration of medications, the patient was                         re-assessed for adequacy to receive sedatives. The                         heart rate, respiratory rate, oxygen saturations,                         blood pressure, adequacy of pulmonary ventilation, and                         response to care were monitored throughout the                         procedure. The physical status of the patient was                         re-assessed after the procedure.                        After obtaining informed consent, the colonoscope was                         passed under direct vision. Throughout the procedure,                         the patient's blood pressure, pulse, and oxygen  saturations were monitored continuously. The                         Colonoscope was introduced through the anus and                         advanced to the the terminal ileum, with                         identification of the appendiceal orifice and IC                         valve. The colonoscopy was performed without                         difficulty. The patient tolerated the procedure well.                         The quality of the bowel preparation was evaluated                         using the BBPS [facility] Bowel Preparation Scale) with                         scores of: Right  Colon = 3, Transverse Colon = 3 and                         Left Colon = 3 (entire mucosa seen well with no                         residual staining, small fragments of stool or opaque                         liquid). The total BBPS score equals 9. The terminal                         ileum, ileocecal valve, appendiceal orifice, and                         rectum were photographed. Findings:      The perianal and digital rectal examinations were normal. Pertinent       negatives include normal sphincter tone and no palpable rectal lesions.      The terminal ileum appeared normal.      The entire examined colon appeared normal.      The retroflexed view of the distal rectum and anal verge was normal and       showed no anal or rectal abnormalities. Impression:            - The examined portion of the ileum was normal.                        - The entire examined colon is normal.                        - The distal rectum and anal verge are normal on  retroflexion view.                        - No specimens collected. Recommendation:        - Discharge patient to home (with escort).                        - Resume previous diet today.                        - Continue present medications.                        - Repeat colonoscopy in 10 years for screening                         purposes. Procedure Code(s):     --- Professional ---                        N8295, Colorectal cancer screening; colonoscopy on                         individual not meeting criteria for high risk Diagnosis Code(s):     --- Professional ---                        Z12.11, Encounter for screening for malignant neoplasm                         of colon CPT copyright 2022 American Medical Association. All rights reserved. The codes documented in this report are preliminary and upon coder review may  be revised to meet current compliance requirements. Dr. Libby Maw Toney Reil  MD, MD 10/08/2022 7:58:38 AM This report has been signed electronically. Number of Addenda: 0 Note Initiated On: 10/08/2022 7:17 AM Scope Withdrawal Time: 0 hours 12 minutes 25 seconds  Total Procedure Duration: 0 hours 16 minutes 31 seconds  Estimated Blood Loss:  Estimated blood loss: none.      [facility]

## 2022-10-08 NOTE — Anesthesia Procedure Notes (Signed)
Date/Time: 10/08/2022 7:40 AM  Performed by: Malva Cogan, CRNAPre-anesthesia Checklist: Patient identified, Emergency Drugs available, Suction available, Patient being monitored and Timeout performed Patient Re-evaluated:Patient Re-evaluated prior to induction Oxygen Delivery Method: Nasal cannula Induction Type: IV induction Placement Confirmation: CO2 detector and positive ETCO2

## 2022-10-08 NOTE — Transfer of Care (Signed)
Immediate Anesthesia Transfer of Care Note  Patient: Diana Black  Procedure(s) Performed: COLONOSCOPY WITH PROPOFOL  Patient Location: PACU  Anesthesia Type:General  Level of Consciousness: drowsy  Airway & Oxygen Therapy: Patient Spontanous Breathing  Post-op Assessment: Report given to RN and Post -op Vital signs reviewed and stable  Post vital signs: Reviewed and stable  Last Vitals:  Vitals Value Taken Time  BP    Temp    Pulse 57 10/08/22 0759  Resp 17 10/08/22 0759  SpO2 100 % 10/08/22 0759  Vitals shown include unfiled device data.  Last Pain:  Vitals:   10/08/22 0655  TempSrc: Temporal  PainSc: 0-No pain         Complications: No notable events documented.

## 2022-10-12 ENCOUNTER — Encounter: Payer: Self-pay | Admitting: Gastroenterology

## 2022-10-14 NOTE — Anesthesia Postprocedure Evaluation (Signed)
Anesthesia Post Note  Patient: Diana Black  Procedure(s) Performed: COLONOSCOPY WITH PROPOFOL  Patient location during evaluation: Endoscopy Anesthesia Type: General Level of consciousness: awake and alert Pain management: pain level controlled Vital Signs Assessment: post-procedure vital signs reviewed and stable Respiratory status: spontaneous breathing, nonlabored ventilation, respiratory function stable and patient connected to nasal cannula oxygen Cardiovascular status: blood pressure returned to baseline and stable Postop Assessment: no apparent nausea or vomiting Anesthetic complications: no   No notable events documented.   Last Vitals:  Vitals:   10/08/22 0800 10/08/22 0810  BP: 95/65 103/66  Pulse: (!) 57 (!) 59  Resp: 17 18  Temp: (!) 36.2 C   SpO2: 100% 100%    Last Pain:  Vitals:   10/09/22 1012  TempSrc:   PainSc: 0-No pain                 Lenard Simmer

## 2022-11-10 ENCOUNTER — Emergency Department: Payer: Federal, State, Local not specified - PPO

## 2022-11-10 ENCOUNTER — Other Ambulatory Visit: Payer: Self-pay

## 2022-11-10 ENCOUNTER — Emergency Department
Admission: EM | Admit: 2022-11-10 | Discharge: 2022-11-11 | Disposition: A | Discharge status: Home / Self Care | Payer: Federal, State, Local not specified - PPO | Attending: Emergency Medicine | Admitting: Emergency Medicine

## 2022-11-10 DIAGNOSIS — R0789 Other chest pain: Secondary | ICD-10-CM | POA: Diagnosis not present

## 2022-11-10 DIAGNOSIS — K8020 Calculus of gallbladder without cholecystitis without obstruction: Secondary | ICD-10-CM | POA: Diagnosis not present

## 2022-11-10 DIAGNOSIS — R079 Chest pain, unspecified: Secondary | ICD-10-CM | POA: Insufficient documentation

## 2022-11-10 DIAGNOSIS — R918 Other nonspecific abnormal finding of lung field: Secondary | ICD-10-CM | POA: Diagnosis not present

## 2022-11-10 LAB — CBC WITH DIFFERENTIAL/PLATELET
Abs Immature Granulocytes: 0.03 10*3/uL (ref 0.00–0.07)
Basophils Absolute: 0.1 10*3/uL (ref 0.0–0.1)
Basophils Relative: 1 %
Eosinophils Absolute: 0.2 10*3/uL (ref 0.0–0.5)
Eosinophils Relative: 4 %
HCT: 41.4 % (ref 36.0–46.0)
Hemoglobin: 13.6 g/dL (ref 12.0–15.0)
Immature Granulocytes: 1 %
Lymphocytes Relative: 37 %
Lymphs Abs: 2 10*3/uL (ref 0.7–4.0)
MCH: 30.4 pg (ref 26.0–34.0)
MCHC: 32.9 g/dL (ref 30.0–36.0)
MCV: 92.6 fL (ref 80.0–100.0)
Monocytes Absolute: 0.5 10*3/uL (ref 0.1–1.0)
Monocytes Relative: 9 %
Neutro Abs: 2.7 10*3/uL (ref 1.7–7.7)
Neutrophils Relative %: 48 %
Platelets: 205 10*3/uL (ref 150–400)
RBC: 4.47 MIL/uL (ref 3.87–5.11)
RDW: 13.1 % (ref 11.5–15.5)
WBC: 5.4 10*3/uL (ref 4.0–10.5)
nRBC: 0 % (ref 0.0–0.2)

## 2022-11-10 LAB — HEPATIC FUNCTION PANEL
ALT: 18 U/L (ref 0–44)
AST: 21 U/L (ref 15–41)
Albumin: 3.9 g/dL (ref 3.5–5.0)
Alkaline Phosphatase: 77 U/L (ref 38–126)
Bilirubin, Direct: 0.3 mg/dL — ABNORMAL HIGH (ref 0.0–0.2)
Indirect Bilirubin: 1.1 mg/dL — ABNORMAL HIGH (ref 0.3–0.9)
Total Bilirubin: 1.4 mg/dL — ABNORMAL HIGH (ref 0.3–1.2)
Total Protein: 6.9 g/dL (ref 6.5–8.1)

## 2022-11-10 LAB — BASIC METABOLIC PANEL
Anion gap: 11 (ref 5–15)
BUN: 12 mg/dL (ref 6–20)
CO2: 22 mmol/L (ref 22–32)
Calcium: 8.8 mg/dL — ABNORMAL LOW (ref 8.9–10.3)
Chloride: 105 mmol/L (ref 98–111)
Creatinine, Ser: 0.72 mg/dL (ref 0.44–1.00)
GFR, Estimated: >60 mL/min (ref >60)
Glucose, Bld: 92 mg/dL (ref 70–99)
Potassium: 3.9 mmol/L (ref 3.5–5.1)
Sodium: 138 mmol/L (ref 135–145)

## 2022-11-10 LAB — TROPONIN I (HIGH SENSITIVITY): Troponin I (High Sensitivity): <2 ng/L (ref <18)

## 2022-11-10 LAB — LIPASE, BLOOD: Lipase: 29 U/L (ref 11–51)

## 2022-11-10 MED ORDER — PANTOPRAZOLE SODIUM 40 MG PO TBEC: 40.0000 mg | DELAYED_RELEASE_TABLET | Freq: Every day | ORAL | 0 refills | Status: DC

## 2022-11-10 MED ORDER — ONDANSETRON 4 MG PO TBDP: 4.0000 mg | ORAL_TABLET | Freq: Three times a day (TID) | ORAL | 0 refills | Status: DC | PRN

## 2022-11-10 MED ORDER — IOHEXOL 350 MG/ML SOLN
75.0000 mL | Freq: Once | INTRAVENOUS | Status: AC | PRN
  Administered 2022-11-10: 75 mL via INTRAVENOUS

## 2022-11-10 MED ORDER — ALUM & MAG HYDROXIDE-SIMETH 200-200-20 MG/5ML PO SUSP
30.0000 mL | Freq: Once | ORAL | Status: AC
  Administered 2022-11-10: 30 mL via ORAL
  Filled 2022-11-10: qty 30

## 2022-11-10 MED ORDER — PANTOPRAZOLE SODIUM 40 MG IV SOLR
40.0000 mg | Freq: Once | INTRAVENOUS | Status: AC
  Administered 2022-11-10: 40 mg via INTRAVENOUS
  Filled 2022-11-10: qty 10

## 2022-11-10 MED ORDER — LIDOCAINE VISCOUS HCL 2 % MT SOLN
15.0000 mL | Freq: Once | OROMUCOSAL | Status: AC
  Administered 2022-11-10: 15 mL via OROMUCOSAL
  Filled 2022-11-10: qty 15

## 2022-11-10 NOTE — ED Provider Notes (Addendum)
[facility] Provider Note    Event Date/Time   First MD Initiated Contact with Patient 11/10/22 2114     (approximate)   History   Chest Pain   HPI  Diana Black is a 57 y.o. female past medical history significant for acid reflux who presents to the emergency department chest pain.  Patient recently moved back after living in [place] for the past 4 years.  States that since that time she was on high-dose Primaquine for malaria treatment.  States that she recently had a colonoscopy that was normal.  States that since that time she has been having ongoing symptoms of a burning sensation to her chest that radiates to both of her shoulders and down her back.  Denies any sharp stabbing pain.  No significant shortness of breath.  Mild intermittent cough.  No sputum production.  Denies any fever or chills.  States that she has a history of acid reflux and was on omeprazole however discontinued given concern for possible side effects of dementia.  Also states that she was taking famotidine irregularly but is not currently taking that at this time.  Denies any significant alcohol use.  No history of DVT or PE.  Denies any swelling to her legs.     Physical Exam   Triage Vital Signs: ED Triage Vitals  Encounter Vitals Group     BP 11/10/22 1928 (!) 144/74     Systolic BP Percentile --      Diastolic BP Percentile --      Pulse Rate 11/10/22 1928 (!) 59     Resp 11/10/22 1928 18     Temp 11/10/22 1928 98 F (36.7 C)     Temp src --      SpO2 11/10/22 1928 93 %     Weight 11/10/22 1930 195 lb (88.5 kg)     Height 11/10/22 1930 5\' 4"  (1.626 m)     Head Circumference --      Peak Flow --      Pain Score 11/10/22 1930 6     Pain Loc --      Pain Education --      Exclude from Growth Chart --     Most recent vital signs: Vitals:   11/10/22 1928  BP: (!) 144/74  Pulse: (!) 59  Resp: 18  Temp: 98 F (36.7 C)  SpO2: 93%    Physical  Exam Constitutional:      Appearance: She is well-developed.  HENT:     Head: Atraumatic.  Eyes:     Conjunctiva/sclera: Conjunctivae normal.  Cardiovascular:     Rate and Rhythm: Regular rhythm.     Heart sounds: Normal heart sounds. No murmur heard. Pulmonary:     Effort: No respiratory distress.  Abdominal:     General: There is no distension.     Tenderness: There is no abdominal tenderness.  Musculoskeletal:        General: Normal range of motion.     Cervical back: Normal range of motion.     Right lower leg: No edema.     Left lower leg: No edema.  Skin:    General: Skin is warm.     Capillary Refill: Capillary refill takes less than 2 seconds.  Neurological:     Mental Status: She is alert. Mental status is at baseline.     IMPRESSION / MDM / ASSESSMENT AND PLAN / ED COURSE  I reviewed the triage vital  signs and the nursing notes.  Differential diagnosis including gastritis/PUD, ACS, pneumonia, tuberculosis, pulmonary embolism.  Low suspicion for dissection, no tearing chest pain and no risk factors.  Pulses are equal and symmetric.  EKG  I, Corena Herter, the attending physician, personally viewed and interpreted this ECG.   Rate: Normal  Rhythm: Normal sinus  Axis: Normal  Intervals: Normal  ST&T Change: None  No tachycardic or bradycardic dysrhythmias while on cardiac telemetry.  RADIOLOGY I independently reviewed imaging, my interpretation of imaging: Chest x-ray without focal findings consistent with pneumonia.  Was read as ill-defined opacities to upper lobe bilaterally including pneumonia, pleural-based masses, parenchymal disease or cavitary disease.  Recommended CT scan of the chest.  LABS (all labs ordered are listed, but only abnormal results are displayed) Labs interpreted as -    Labs Reviewed  BASIC METABOLIC PANEL - Abnormal; Notable for the following components:      Result Value   Calcium 8.8 (*)    All other components within normal  limits  HEPATIC FUNCTION PANEL - Abnormal; Notable for the following components:   Total Bilirubin 1.4 (*)    Bilirubin, Direct 0.3 (*)    Indirect Bilirubin 1.1 (*)    All other components within normal limits  CBC WITH DIFFERENTIAL/PLATELET  LIPASE, BLOOD  TROPONIN I (HIGH SENSITIVITY)     MDM  Low risk heart score, troponin undetectable, have a low suspicion for ACS.  CTA ordered to evaluate for possible pulmonary embolism and further evaluation for the cavitary lesions to the upper lobes.  Attempted to send a quantiferon-TB gold test however unable to do at this time and is a send out lab.  If CT scan more concerning for tuberculosis will discuss with infectious disease further workup.  Otherwise we will start on PPI and have her follow-up as an outpatient with primary care provider.  11:46 PM  CT scan with no findings of cavitary lesions.  No signs of pulmonary embolism.  No concern for tuberculosis on CT scan.  Did note gallstone.  Bedside ultrasound with gallstone with no findings of acute cholecystitis.  Negative Murphy sign.  Elevated T. bili but otherwise normal lab work.  Normal LFTs.  Given strict return precautions for any ongoing or worsening pain.  Given information to call and follow-up with general surgery as an outpatient.  Discussed calling primary care physician for close follow-up.       PROCEDURES:  Critical Care performed: No  Ultrasound ED Abd  Date/Time: 11/10/2022 11:41 PM  Performed by: Corena Herter, MD Authorized by: Corena Herter, MD   Procedure details:    Indications: abdominal pain     Assessment for:  Gallstones   Hepatobiliary:  Visualized        Hepatobiliary findings:    Common bile duct:  Unable to visualize   Gallbladder wall:  Normal   Gallbladder stones: identified     Mass: not identified     Intra-abdominal fluid: not identified     Sonographic Murphy's sign: negative     Patient's presentation is most consistent with  acute presentation with potential threat to life or bodily function.   MEDICATIONS ORDERED IN ED: Medications  pantoprazole (PROTONIX) injection 40 mg (40 mg Intravenous Given 11/10/22 2256)  alum & mag hydroxide-simeth (MAALOX/MYLANTA) 200-200-20 MG/5ML suspension 30 mL (30 mLs Oral Given 11/10/22 2224)  lidocaine (XYLOCAINE) 2 % viscous mouth solution 15 mL (15 mLs Mouth/Throat Given 11/10/22 2225)  iohexol (OMNIPAQUE) 350 MG/ML injection 75 mL (75  mLs Intravenous Contrast Given 11/10/22 2316)    FINAL CLINICAL IMPRESSION(S) / ED DIAGNOSES   Final diagnoses:  Chest pain, unspecified type  Calculus of gallbladder without cholecystitis without obstruction     Rx / DC Orders   ED Discharge Orders          Ordered    pantoprazole (PROTONIX) 40 MG tablet  Daily        11/10/22 2321    ondansetron (ZOFRAN-ODT) 4 MG disintegrating tablet  Every 8 hours PRN        11/10/22 2343             Note:  This document was prepared using Dragon voice recognition software and may include unintentional dictation errors.   Corena Herter, MD 11/10/22 1610    Corena Herter, MD 11/10/22 [phone]

## 2022-11-10 NOTE — Discharge Instructions (Signed)
You are seen in the emergency department for abdominal pain and chest pain.  You had a CT scan with contrast dye that did not show any signs of blood clots.  It did not show any cavitary lesions in your lungs.  There is no finding on your CT scan that would be concerning for tuberculosis or pneumonia.  Your heart enzyme was normal, not concerned that you are having a heart attack today.  You had findings of gallstones but no findings of a gallbladder infection.  You are given information to follow-up as an outpatient with general surgery to discussed further management of your gallstones.  You are given information for gallbladder eating plan.  Return to the emergency department to get worsening symptoms.  You are started on an acid reducing medication and given a prescription for nausea medication.  zofran (ondansetron) - nausea medication, take 1 tablet every 8 hours as needed for nausea/vomiting.  Thank you for choosing [place] for your health care, it was my pleasure to care for you today!  Corena Herter, MD

## 2022-11-10 NOTE — ED Triage Notes (Signed)
Pt reports chest discomfort that radiates into her back for the past few days, pt states it feels like indigestion. Pt states she has been taking malaria pills since August because she just returned from living in [place] for the past 4 years. Pt denies any known cardiac hx.

## 2022-11-16 ENCOUNTER — Ambulatory Visit: Payer: Federal, State, Local not specified - PPO | Admitting: General Surgery

## 2022-11-16 ENCOUNTER — Encounter: Payer: Self-pay | Admitting: General Surgery

## 2022-11-16 VITALS — BP 102/68 | HR 63 | Ht 63.0 in | Wt 195.4 lb

## 2022-11-16 DIAGNOSIS — K8000 Calculus of gallbladder with acute cholecystitis without obstruction: Secondary | ICD-10-CM | POA: Diagnosis not present

## 2022-11-16 DIAGNOSIS — K8020 Calculus of gallbladder without cholecystitis without obstruction: Secondary | ICD-10-CM

## 2022-11-16 DIAGNOSIS — R7989 Other specified abnormal findings of blood chemistry: Secondary | ICD-10-CM | POA: Diagnosis not present

## 2022-11-16 NOTE — Patient Instructions (Addendum)
Cholelithiasis  Cholelithiasis is a disease in which gallstones form in the gallbladder. The gallbladder is an organ that stores bile. Bile is a fluid that helps to digest fats. Gallstones begin as small crystals and can slowly grow into stones. They may cause no symptoms until they block the gallbladder duct, or cystic duct, when the gallbladder tightens, or contracts, after food is eaten. This can cause pain and is known as a gallbladder attack, or biliary colic. There are two main types of gallstones: Cholesterol stones. These are the most common type of gallstone. These stones are made of hardened cholesterol and are usually yellow-green in color. Pigment stones. These are dark in color and are made of a red-yellow substance, called bilirubin,that forms when hemoglobin from red blood cells breaks down. What are the causes? This condition may be caused by too little or too much of the substances that are in bile. This can happen if the bile: Has too much bilirubin. This can happen in certain blood diseases, such as sickle cell anemia. Has too much cholesterol. Does not have enough bile salts. These salts help the body absorb and digest fats. It can also happen if the gallbladder is not emptying completely. This is common during pregnancy. What increases the risk? The following factors may make you more likely to develop this condition: Being older than 56 years of age. Eating a diet that is heavy in fried foods, fat, and refined carbohydrates, such as white bread and white rice. Being female. Having multiple pregnancies. Using medicines that contain female hormones (estrogen) for a long time. Having certain medical problems, such as: Diabetes mellitus. Obesity. Cystic fibrosis. Crohn's disease. Cirrhosis or other long-term (chronic) liver disease. Certain blood diseases, such as sickle cell anemia or leukemia. Having a family history of gallstones. Losing weight quickly. What are the  signs or symptoms? In many cases, having gallstones causes no symptoms. These are called silent gallstones. If a gallstone blocks your bile duct, it can cause a gallbladder attack. The main symptom of a gallbladder attack is sudden pain in the upper right part of the abdomen. The pain: Usually comes at night or after eating. Can last for one hour or more. Can spread to your right shoulder, back, or chest. Can feel like indigestion. This is discomfort, burning, or fullness in your upper abdomen. If the bile duct is blocked for more than a few hours, it can cause an infection or inflammation of your gallbladder (cholecystitis), liver, or pancreas. This can cause: Nausea or vomiting. Bloating. Pain in your abdomen that lasts for 5 hours or longer. Tenderness in your upper abdomen, often in the upper right section and under your rib cage. Fever or chills. Skin or the white parts of your eyes turning yellow (jaundice). This usually happens when a stone has blocked bile from passing through the bile duct. Dark pee (urine) or light-colored poop (stools). How is this diagnosed? This condition may be diagnosed based on: A physical exam. Your medical history. Ultrasound. CT scan. MRI. You may also have other tests, including: Blood tests to check for infection or inflammation. The HIDA scan to see the gallbladder and the bile ducts. An endoscope to check for blockage in the bile ducts. How is this treated? Treatment depends on the severity of your symptoms. Silent gallstones do not need treatment. You may need treatment if a blockage causes a gallbladder attack or other symptoms. Treatment may include: If symptoms are mild, you may care for yourself at home.  For mild symptoms: Stop eating and drinking for 12-24 hours. You may drink water and clear liquids. This helps to "cool down" your gallbladder. After 1 or 2 days, eat a diet of simple or clear foods, such as broths and crackers. Take medicines  for pain or nausea. Take antibiotics if you have an infection. If symptoms are severe, you may: Stay in the hospital for pain control or to treat severe infection. Have surgery to remove the gallbladder (cholecystectomy). This is the most common treatment if all other treatments have not worked. Take medicines to break up gallstones. Medicines may be used for up to 6-12 months. Have an procedure to capture and remove gallstones. Follow these instructions at home: Medicines Take over-the-counter and prescription medicines only as told by your health care provider. If you were prescribed antibiotics, take them as told by your provider. Do not stop using the antibiotic even if you start to feel better. Ask your provider if the medicine prescribed to you requires you to avoid driving or using machinery. Eating and drinking Drink enough fluid to keep your pee pale yellow. This is important during a gallbladder attack. Water and clear liquids are preferred. Follow a healthy diet. This includes: Reducing fatty foods, such as fried food and foods high in cholesterol. Reducing refined carbohydrates, such as white bread and white rice. Eating more fiber. Aim for foods such as almonds, fruit, and beans. General instructions Do not use any products that contain nicotine or tobacco. These products include cigarettes, chewing tobacco, and vaping devices, such as e-cigarettes. If you need help quitting, ask your provider. Maintain a healthy weight. Keep all follow-up visits. These may include seeing a specialist or a Careers adviser. Where to find more information [employer] and [employer]estive [employer]: [email or web address] Contact a health care provider if: You think you have had a gallbladder attack. You have been diagnosed with silent gallstones and you develop indigestion or pain in your abdomen. You have pain from a gallbladder attack that lasts for more than 2 hours. You begin to have  attacks more often. You have nausea. You have dark pee or light-colored poop. Get help right away if: You have pain in your abdomen that lasts for more than 5 hours or is getting worse. You have a fever or chills. You have vomiting that does not go away. You develop jaundice. This information is not intended to replace advice given to you by your health care provider. Make sure you discuss any questions you have with your health care provider. Document Revised: 11/09/2021 Document Reviewed: 11/09/2021 Elsevier Patient Education  2024 [employer].

## 2022-11-16 NOTE — Progress Notes (Signed)
Patient ID: JHERI MITTER, female   DOB: Apr 06, 1965, 57 y.o.   MRN: [id number] CC: Epigastric Pain History of Present Illness JAMANI ELEY is a 57 y.o. female with PMH significant for obesity who presents after being seen in the emergency department for chest pain.  During that workup she had a bedside ultrasound that showed gallstones, and elevated bilirubin.  She reports that when she presented to the ED she had epigastric burning pain.  She also had symptoms of reflux with some nausea.  She said that she also felt pain in her back.  She does report that she has a history of GERD and was previously on a PPI but she had stopped taking it.  Since then she continues to note more mild epigastric pain that rarely radiates to the right upper quadrant into the back.  She also reports that her pain is somewhat better with initiation of a PPI although it is not gone away completely.  She denies any fevers or chills during her episodes but also says that she has never had previous episodes like this..  Past Medical History Past Medical History:  Diagnosis Date   Environmental allergies    GERD (gastroesophageal reflux disease)       Past Surgical History:  Procedure Laterality Date   CESAREAN SECTION     COLONOSCOPY WITH PROPOFOL N/A 10/08/2022   Procedure: COLONOSCOPY WITH PROPOFOL;  Surgeon: Toney Reil, MD;  Location: [facility] ENDOSCOPY;  Service: Gastroenterology;  Laterality: N/A;    No Known Allergies  Current Outpatient Medications  Medication Sig Dispense Refill   latanoprost (XALATAN) 0.005 % ophthalmic solution latanoprost 0.005 % eye drops     pantoprazole (PROTONIX) 40 MG tablet Take 1 tablet (40 mg total) by mouth daily. 30 tablet 0   No current facility-administered medications for this visit.    Family History Family History  Problem Relation Age of Onset   Diabetes Mother    Bladder Cancer Mother    Memory loss Mother    Stroke Father        Social  History Social History   Tobacco Use   Smoking status: Never   Smokeless tobacco: Never  Vaping Use   Vaping status: Never Used  Substance Use Topics   Alcohol use: Not Currently   Drug use: Never       ROS Full ROS of systems performed and is otherwise negative there than what is stated in the HPI  Physical Exam Blood pressure 102/68, pulse 63, height 5\' 3"  (1.6 m), weight 195 lb 6.4 oz (88.6 kg), SpO2 97%.  Acute distress, PERRLA, normal work of breathing on room air, clear to oscillation bilaterally, regular rate and rhythm.  Abdomen is obese, soft without tenderness no rebound guarding.  No jaundice, moving all extremities spontaneously  Data Reviewed I reviewed her labs and it is notable for an elevated bilirubin at 1.4.  He had a normal white count.  I also reviewed her CTA of her chest that goes down to her gallbladder.  There are some stones within the gallbladder but no thickened gallbladder wall and I could not see down to the CBD.  I have personally reviewed the patient's imaging and medical records.    Assessment    57 year old female with epigastric burning pain and eat per ED report had gallstones on ultrasound and she also has gallstones on CT.  She does have an elevated bilirubin.  Plan We talked about the fact that most of her  symptoms sound more like reflux symptoms however she does have gallstones on CT and some pain in the right upper quadrant.  I would like to get an ultrasound to make sure that there is no dilation of her CBD given her hyperbilirubinemia.  We will also have her repeat labs.  We will see her back in 4 weeks.  She has been prescribed a PPI and if her pain is completely gone then we can attribute this more to GERD however if she continues to have pain over the intervening 4 weeks I think it is reasonable to offer her a cholecystectomy  Kandis Cocking 11/16/2022, 2:36 PM

## 2022-11-17 ENCOUNTER — Other Ambulatory Visit
Admission: RE | Admit: 2022-11-17 | Discharge: 2022-11-17 | Disposition: A | Discharge status: Home / Self Care | Payer: Federal, State, Local not specified - PPO | Attending: Nurse Practitioner | Admitting: Nurse Practitioner

## 2022-11-17 ENCOUNTER — Encounter: Payer: Self-pay | Admitting: Nurse Practitioner

## 2022-11-17 ENCOUNTER — Other Ambulatory Visit
Admission: RE | Admit: 2022-11-17 | Discharge: 2022-11-17 | Disposition: A | Discharge status: Home / Self Care | Payer: Federal, State, Local not specified - PPO | Source: Ambulatory Visit | Attending: General Surgery | Admitting: General Surgery

## 2022-11-17 ENCOUNTER — Ambulatory Visit: Payer: Federal, State, Local not specified - PPO | Admitting: Nurse Practitioner

## 2022-11-17 VITALS — BP 110/76 | HR 78 | Temp 98.4°F | Resp 16 | Ht 63.0 in | Wt 198.4 lb

## 2022-11-17 DIAGNOSIS — Z111 Encounter for screening for respiratory tuberculosis: Secondary | ICD-10-CM

## 2022-11-17 DIAGNOSIS — K8000 Calculus of gallbladder with acute cholecystitis without obstruction: Secondary | ICD-10-CM | POA: Diagnosis not present

## 2022-11-17 DIAGNOSIS — R918 Other nonspecific abnormal finding of lung field: Secondary | ICD-10-CM

## 2022-11-17 DIAGNOSIS — K8020 Calculus of gallbladder without cholecystitis without obstruction: Secondary | ICD-10-CM

## 2022-11-17 LAB — COMPREHENSIVE METABOLIC PANEL
ALT: 18 U/L (ref 0–44)
AST: 22 U/L (ref 15–41)
Albumin: 3.9 g/dL (ref 3.5–5.0)
Alkaline Phosphatase: 73 U/L (ref 38–126)
Anion gap: 10 (ref 5–15)
BUN: 13 mg/dL (ref 6–20)
CO2: 24 mmol/L (ref 22–32)
Calcium: 8.8 mg/dL — ABNORMAL LOW (ref 8.9–10.3)
Chloride: 104 mmol/L (ref 98–111)
Creatinine, Ser: 0.74 mg/dL (ref 0.44–1.00)
GFR, Estimated: >60 mL/min (ref >60)
Glucose, Bld: 106 mg/dL — ABNORMAL HIGH (ref 70–99)
Potassium: 3.3 mmol/L — ABNORMAL LOW (ref 3.5–5.1)
Sodium: 138 mmol/L (ref 135–145)
Total Bilirubin: 1.2 mg/dL (ref 0.3–1.2)
Total Protein: 6.9 g/dL (ref 6.5–8.1)

## 2022-11-17 MED ORDER — PANTOPRAZOLE SODIUM 40 MG PO TBEC
40.0000 mg | DELAYED_RELEASE_TABLET | Freq: Every day | ORAL | 1 refills | Status: DC
Start: 2022-11-17 — End: 2022-12-14

## 2022-11-17 NOTE — Progress Notes (Signed)
[facility] [address] [place], [place] [address]  Internal MEDICINE  Office Visit Note  Patient Name: Diana Black  [id number]  [id number]  Date of Service: 11/17/2022  Chief Complaint  Patient presents with   Follow-up    ED f/u chest pain    HPI Diana Black presents for a follow-up visit for ED visit for chest pain  Went off omeprazole, had colonoscopy in August,  Epigastric pain, tightness, shoulder pain, low back pain.   Gall stones -- seen gen surg, went back on PPI and waiting for now, no surgery.  Area at apices of both lungs concern for TB, pna, or other abnormalities  Has repeat cmp for labs   Abnormal chest xray, concern for TB  Current Medication: Outpatient Encounter Medications as of 11/17/2022  Medication Sig   latanoprost (XALATAN) 0.005 % ophthalmic solution latanoprost 0.005 % eye drops   [DISCONTINUED] pantoprazole (PROTONIX) 40 MG tablet Take 1 tablet (40 mg total) by mouth daily.   pantoprazole (PROTONIX) 40 MG tablet Take 1 tablet (40 mg total) by mouth daily.   No facility-administered encounter medications on file as of 11/17/2022.    Surgical History: Past Surgical History:  Procedure Laterality Date   CESAREAN SECTION     COLONOSCOPY WITH PROPOFOL N/A 10/08/2022   Procedure: COLONOSCOPY WITH PROPOFOL;  Surgeon: Toney Reil, MD;  Location: [facility] ENDOSCOPY;  Service: Gastroenterology;  Laterality: N/A;    Medical History: Past Medical History:  Diagnosis Date   Environmental allergies    GERD (gastroesophageal reflux disease)     Family History: Family History  Problem Relation Age of Onset   Diabetes Mother    Bladder Cancer Mother    Memory loss Mother    Stroke Father     Social History   Socioeconomic History   Marital status: Married    Spouse name: Not on file   Number of children: Not on file   Years of education: Not on file   Highest education level: Not on file  Occupational History   Not on  file  Tobacco Use   Smoking status: Never   Smokeless tobacco: Never  Vaping Use   Vaping status: Never Used  Substance and Sexual Activity   Alcohol use: Yes    Comment: occ   Drug use: Never   Sexual activity: Yes  Other Topics Concern   Not on file  Social History Narrative   Not on file   Social Determinants of Health   Financial Resource Strain: Not on file  Food Insecurity: Not on file  Transportation Needs: Not on file  Physical Activity: Not on file  Stress: Not on file  Social Connections: Not on file  Intimate Partner Violence: Not on file      Review of Systems  Constitutional:  Negative for chills, fatigue and unexpected weight change.  HENT:  Negative for congestion, postnasal drip, rhinorrhea, sneezing and sore throat.   Eyes:  Negative for redness.  Respiratory: Negative.  Negative for cough, chest tightness, shortness of breath and wheezing.   Cardiovascular: Negative.  Negative for chest pain and palpitations.  Gastrointestinal: Negative.  Negative for abdominal pain, constipation, diarrhea, nausea and vomiting.  Genitourinary:  Negative for dysuria and frequency.  Musculoskeletal:  Negative for arthralgias, back pain, joint swelling and neck pain.  Skin:  Negative for rash.  Neurological: Negative.  Negative for tremors and numbness.  Hematological:  Negative for adenopathy. Does not bruise/bleed easily.  Psychiatric/Behavioral:  Negative for  behavioral problems (Depression), sleep disturbance and suicidal ideas. The patient is not nervous/anxious.     Vital Signs: BP 110/76   Pulse 78   Temp 98.4 F (36.9 C)   Resp 16   Ht 5\' 3"  (1.6 m)   Wt 198 lb 6.4 oz (90 kg)   LMP  (LMP Unknown)   SpO2 97%   BMI 35.14 kg/m    Physical Exam Vitals reviewed.  Constitutional:      General: She is not in acute distress.    Appearance: Normal appearance. She is obese. She is not ill-appearing.  HENT:     Head: Normocephalic and atraumatic.  Eyes:      Pupils: Pupils are equal, round, and reactive to light.  Cardiovascular:     Rate and Rhythm: Normal rate and regular rhythm.  Pulmonary:     Effort: Pulmonary effort is normal. No respiratory distress.  Neurological:     Mental Status: She is alert and oriented to person, place, and time.  Psychiatric:        Mood and Affect: Mood normal.        Behavior: Behavior normal.        Assessment/Plan: 1. Gall stones Continue pantoprazole as prescribed.  - pantoprazole (PROTONIX) 40 MG tablet; Take 1 tablet (40 mg total) by mouth daily.  Dispense: 90 tablet; Refill: 1  2. Abnormality of lung on chest x-ray TB gold test ordered, will call with results  - QuantiFERON-TB Gold Plus  3. Screening for tuberculosis TB gold test ordered, will call with results.  - QuantiFERON-TB Gold Plus   General Counseling: Diana Black verbalizes understanding of the findings of todays visit and agrees with plan of treatment. I have discussed any further diagnostic evaluation that may be needed or ordered today. We also reviewed her medications today. she has been encouraged to call the office with any questions or concerns that should arise related to todays visit.    Orders Placed This Encounter  Procedures   QuantiFERON-TB Gold Plus    Meds ordered this encounter  Medications   pantoprazole (PROTONIX) 40 MG tablet    Sig: Take 1 tablet (40 mg total) by mouth daily.    Dispense:  90 tablet    Refill:  1    Return if symptoms worsen or fail to improve, for will call with lab result.   Total time spent:30 Minutes Time spent includes review of chart, medications, test results, and follow up plan with the patient.   [place] Controlled Substance Database was reviewed by me.  This patient was seen by Sallyanne Kuster, FNP-C in collaboration with Dr. Beverely Risen as a part of collaborative care agreement.   Arnett Duddy R. Tedd Sias, MSN, FNP-C Internal medicine

## 2022-11-20 LAB — QUANTIFERON-TB GOLD PLUS: QuantiFERON-TB Gold Plus: NEGATIVE

## 2022-11-20 LAB — QUANTIFERON-TB GOLD PLUS (RQFGPL)
QuantiFERON Mitogen Value: >10 [IU]/mL
QuantiFERON Nil Value: 0.15 [IU]/mL
QuantiFERON TB1 Ag Value: 0.02 [IU]/mL
QuantiFERON TB2 Ag Value: 0.02 [IU]/mL

## 2022-11-23 ENCOUNTER — Ambulatory Visit
Admission: RE | Admit: 2022-11-23 | Discharge: 2022-11-23 | Disposition: A | Discharge status: Home / Self Care | Payer: Federal, State, Local not specified - PPO | Source: Ambulatory Visit | Attending: General Surgery | Admitting: General Surgery

## 2022-11-23 DIAGNOSIS — K8000 Calculus of gallbladder with acute cholecystitis without obstruction: Secondary | ICD-10-CM | POA: Diagnosis not present

## 2022-11-23 DIAGNOSIS — K8020 Calculus of gallbladder without cholecystitis without obstruction: Secondary | ICD-10-CM | POA: Diagnosis not present

## 2022-12-01 ENCOUNTER — Encounter: Payer: Self-pay | Admitting: Nurse Practitioner

## 2022-12-14 ENCOUNTER — Other Ambulatory Visit: Payer: Self-pay

## 2022-12-14 ENCOUNTER — Other Ambulatory Visit: Payer: Self-pay | Admitting: Internal Medicine

## 2022-12-14 ENCOUNTER — Encounter: Payer: Self-pay | Admitting: General Surgery

## 2022-12-14 ENCOUNTER — Ambulatory Visit: Payer: Federal, State, Local not specified - PPO | Admitting: Internal Medicine

## 2022-12-14 ENCOUNTER — Ambulatory Visit (INDEPENDENT_AMBULATORY_CARE_PROVIDER_SITE_OTHER): Payer: Federal, State, Local not specified - PPO | Admitting: General Surgery

## 2022-12-14 ENCOUNTER — Encounter: Payer: Self-pay | Admitting: Internal Medicine

## 2022-12-14 VITALS — BP 105/71 | HR 63 | Temp 98.2°F | Ht 63.0 in | Wt 197.0 lb

## 2022-12-14 VITALS — BP 106/71 | HR 60 | Temp 98.0°F | Resp 16 | Ht 63.0 in | Wt 197.4 lb

## 2022-12-14 DIAGNOSIS — K8000 Calculus of gallbladder with acute cholecystitis without obstruction: Secondary | ICD-10-CM | POA: Diagnosis not present

## 2022-12-14 DIAGNOSIS — K8020 Calculus of gallbladder without cholecystitis without obstruction: Secondary | ICD-10-CM

## 2022-12-14 DIAGNOSIS — E876 Hypokalemia: Secondary | ICD-10-CM | POA: Diagnosis not present

## 2022-12-14 DIAGNOSIS — R918 Other nonspecific abnormal finding of lung field: Secondary | ICD-10-CM

## 2022-12-14 DIAGNOSIS — K810 Acute cholecystitis: Secondary | ICD-10-CM | POA: Diagnosis not present

## 2022-12-14 DIAGNOSIS — K8050 Calculus of bile duct without cholangitis or cholecystitis without obstruction: Secondary | ICD-10-CM

## 2022-12-14 DIAGNOSIS — K219 Gastro-esophageal reflux disease without esophagitis: Secondary | ICD-10-CM | POA: Diagnosis not present

## 2022-12-14 MED ORDER — PANTOPRAZOLE SODIUM 40 MG PO TBEC: 40.0000 mg | DELAYED_RELEASE_TABLET | Freq: Every day | ORAL | 1 refills | Status: DC

## 2022-12-14 NOTE — Patient Instructions (Signed)
You have requested to have your gallbladder removed. This will be done at [facility] with Dr. Maurine Minister.  You will most likely be out of work 1-2 weeks for this surgery.  If you have FMLA or disability paperwork that needs filled out you may drop this off at our office or this can be faxed to [phone].  You will return after your post-op appointment with a lifting restriction for approximately 4 more weeks.  You will be able to eat anything you would like to following surgery. But, start by eating a bland diet and advance this as tolerated. The Gallbladder diet is below, please go as closely by this diet as possible prior to surgery to avoid any further attacks.  Please see the (blue)pre-care form that you have been given today. Our surgery scheduler will call you to verify surgery date and to go over information.   If you have any questions, please call our office.  Laparoscopic Cholecystectomy Laparoscopic cholecystectomy is surgery to remove the gallbladder. The gallbladder is located in the upper right part of the abdomen, behind the liver. It is a storage sac for bile, which is produced in the liver. Bile aids in the digestion and absorption of fats. Cholecystectomy is often done for inflammation of the gallbladder (cholecystitis). This condition is usually caused by a buildup of gallstones (cholelithiasis) in the gallbladder. Gallstones can block the flow of bile, and that can result in inflammation and pain. In severe cases, emergency surgery may be required. If emergency surgery is not required, you will have time to prepare for the procedure. Laparoscopic surgery is an alternative to open surgery. Laparoscopic surgery has a shorter recovery time. Your common bile duct may also need to be examined during the procedure. If stones are found in the common bile duct, they may be removed. LET [facility] CARE PROVIDER KNOW ABOUT: Any allergies you have. All medicines you are taking,  including vitamins, herbs, eye drops, creams, and over-the-counter medicines. Previous problems you or members of your family have had with the use of anesthetics. Any blood disorders you have. Previous surgeries you have had.  Any medical conditions you have. RISKS AND COMPLICATIONS Generally, this is a safe procedure. However, problems may occur, including: Infection. Bleeding. Allergic reactions to medicines. Damage to other structures or organs. A stone remaining in the common bile duct. A bile leak from the cyst duct that is clipped when your gallbladder is removed. The need to convert to open surgery, which requires a larger incision in the abdomen. This may be necessary if your surgeon thinks that it is not safe to continue with a laparoscopic procedure. BEFORE THE PROCEDURE Ask your health care provider about: Changing or stopping your regular medicines. This is especially important if you are taking diabetes medicines or blood thinners. Taking medicines such as aspirin and ibuprofen. These medicines can thin your blood. Do not take these medicines before your procedure if your health care provider instructs you not to. Follow instructions from your health care provider about eating or drinking restrictions. Let your health care provider know if you develop a cold or an infection before surgery. Plan to have someone take you home after the procedure. Ask your health care provider how your surgical site will be marked or identified. You may be given antibiotic medicine to help prevent infection. PROCEDURE To reduce your risk of infection: Your health care team will wash or sanitize their hands. Your skin will be washed with soap. An IV  tube may be inserted into one of your veins. You will be given a medicine to make you fall asleep (general anesthetic). A breathing tube will be placed in your mouth. The surgeon will make several small cuts (incisions) in your abdomen. A thin,  lighted tube (laparoscope) that has a tiny camera on the end will be inserted through one of the small incisions. The camera on the laparoscope will send a picture to a TV screen (monitor) in the operating room. This will give the surgeon a good view inside your abdomen. A gas will be pumped into your abdomen. This will expand your abdomen to give the surgeon more room to perform the surgery. Other tools that are needed for the procedure will be inserted through the other incisions. The gallbladder will be removed through one of the incisions. After your gallbladder has been removed, the incisions will be closed with stitches (sutures), staples, or skin glue. Your incisions may be covered with a bandage (dressing). The procedure may vary among health care providers and hospitals. AFTER THE PROCEDURE Your blood pressure, heart rate, breathing rate, and blood oxygen level will be monitored often until the medicines you were given have worn off. You will be given medicines as needed to control your pain.   This information is not intended to replace advice given to you by your health care provider. Make sure you discuss any questions you have with your health care provider.   Document Released: 01/25/2005 Document Revised: 10/16/2014 Document Reviewed: 09/06/2012 Elsevier Interactive Patient Education 2016 Elsevier Inc.   Low-Fat Diet for Gallbladder Conditions A low-fat diet can be helpful if you have pancreatitis or a gallbladder condition. With these conditions, your pancreas and gallbladder have trouble digesting fats. A healthy eating plan with less fat will help rest your pancreas and gallbladder and reduce your symptoms. WHAT DO I NEED TO KNOW ABOUT THIS DIET? Eat a low-fat diet. Reduce your fat intake to less than 20-30% of your total daily calories. This is less than 50-60 g of fat per day. Remember that you need some fat in your diet. Ask your dietician what your daily goal should  be. Choose nonfat and low-fat healthy foods. Look for the words "nonfat," "low fat," or "fat free." As a guide, look on the label and choose foods with less than 3 g of fat per serving. Eat only one serving. Avoid alcohol. Do not smoke. If you need help quitting, talk with your health care provider. Eat small frequent meals instead of three large heavy meals. WHAT FOODS CAN I EAT? Grains Include healthy grains and starches such as potatoes, wheat bread, fiber-rich cereal, and brown rice. Choose whole grain options whenever possible. In adults, whole grains should account for 45-65% of your daily calories.  Fruits and Vegetables Eat plenty of fruits and vegetables. Fresh fruits and vegetables add fiber to your diet. Meats and Other Protein Sources Eat lean meat such as chicken and pork. Trim any fat off of meat before cooking it. Eggs, fish, and beans are other sources of protein. In adults, these foods should account for 10-35% of your daily calories. Dairy Choose low-fat milk and dairy options. Dairy includes fat and protein, as well as calcium.  Fats and Oils Limit high-fat foods such as fried foods, sweets, baked goods, sugary drinks.  Other Creamy sauces and condiments, such as mayonnaise, can add extra fat. Think about whether or not you need to use them, or use smaller amounts or low fat options.  WHAT FOODS ARE NOT RECOMMENDED? High fat foods, such as: [employer]. Ice cream. [place] toast. Sweet rolls. Pizza. Cheese bread. Foods covered with batter, butter, creamy sauces, or cheese. Fried foods. Sugary drinks and desserts. Foods that cause gas or bloating   This information is not intended to replace advice given to you by your health care provider. Make sure you discuss any questions you have with your health care provider.   Document Released: 01/30/2013 Document Reviewed: 01/30/2013 Elsevier Interactive Patient Education [employer]

## 2022-12-14 NOTE — Progress Notes (Unsigned)
[facility] [address] [place], [place] [address]  Internal MEDICINE  Office Visit Note  Patient Name: Diana Black  [id number]  [id number]  Date of Service: 12/15/2022  Chief Complaint  Patient presents with   Follow-up    Review Chest X-Ray   Gastroesophageal Reflux    HPI Pt is here to discuss her CXR Incidental findings for bilateral opacities, was checked for TB which is negative  Has been on Protonix for a long time  Went to ED for acute visit and now has a dx of acute cholecystitis  Low K on labs     Current Medication: Outpatient Encounter Medications as of 12/14/2022  Medication Sig   latanoprost (XALATAN) 0.005 % ophthalmic solution latanoprost 0.005 % eye drops   [DISCONTINUED] pantoprazole (PROTONIX) 40 MG tablet Take 1 tablet (40 mg total) by mouth daily.   No facility-administered encounter medications on file as of 12/14/2022.    Surgical History: Past Surgical History:  Procedure Laterality Date   CESAREAN SECTION     COLONOSCOPY WITH PROPOFOL N/A 10/08/2022   Procedure: COLONOSCOPY WITH PROPOFOL;  Surgeon: Toney Reil, MD;  Location: [facility] ENDOSCOPY;  Service: Gastroenterology;  Laterality: N/A;    Medical History: Past Medical History:  Diagnosis Date   Environmental allergies    GERD (gastroesophageal reflux disease)     Family History: Family History  Problem Relation Age of Onset   Diabetes Mother    Bladder Cancer Mother    Memory loss Mother    Stroke Father     Social History   Socioeconomic History   Marital status: Married    Spouse name: Not on file   Number of children: Not on file   Years of education: Not on file   Highest education level: Not on file  Occupational History   Not on file  Tobacco Use   Smoking status: Never    Passive exposure: Never   Smokeless tobacco: Never  Vaping Use   Vaping status: Never Used  Substance and Sexual Activity   Alcohol use: Yes    Comment: occ   Drug  use: Never   Sexual activity: Yes  Other Topics Concern   Not on file  Social History Narrative   Not on file   Social Determinants of Health   Financial Resource Strain: Not on file  Food Insecurity: Not on file  Transportation Needs: Not on file  Physical Activity: Not on file  Stress: Not on file  Social Connections: Not on file  Intimate Partner Violence: Not on file      Review of Systems  Constitutional:  Negative for chills, fatigue and unexpected weight change.  HENT:  Positive for postnasal drip. Negative for congestion, rhinorrhea, sneezing and sore throat.   Eyes:  Negative for redness.  Respiratory:  Negative for cough, chest tightness and shortness of breath.   Cardiovascular:  Negative for chest pain and palpitations.  Gastrointestinal:  Positive for abdominal pain. Negative for constipation, diarrhea, nausea and vomiting.  Genitourinary:  Negative for dysuria and frequency.  Musculoskeletal:  Negative for arthralgias, back pain, joint swelling and neck pain.  Skin:  Negative for rash.  Neurological: Negative.  Negative for tremors and numbness.  Hematological:  Negative for adenopathy. Does not bruise/bleed easily.  Psychiatric/Behavioral:  Negative for behavioral problems (Depression), sleep disturbance and suicidal ideas. The patient is not nervous/anxious.     Vital Signs: BP 106/71   Pulse 60   Temp 98 F (36.7  C)   Resp 16   Ht 5\' 3"  (1.6 m)   Wt 197 lb 6.4 oz (89.5 kg)   LMP  (LMP Unknown)   SpO2 98%   BMI 34.97 kg/m    Physical Exam Constitutional:      Appearance: Normal appearance.  HENT:     Head: Normocephalic and atraumatic.     Nose: Nose normal.     Mouth/Throat:     Mouth: Mucous membranes are moist.     Pharynx: No posterior oropharyngeal erythema.  Eyes:     Extraocular Movements: Extraocular movements intact.     Pupils: Pupils are equal, round, and reactive to light.  Cardiovascular:     Pulses: Normal pulses.     Heart  sounds: Normal heart sounds.  Pulmonary:     Effort: Pulmonary effort is normal.     Breath sounds: Normal breath sounds.  Neurological:     General: No focal deficit present.     Mental Status: She is alert.  Psychiatric:        Mood and Affect: Mood normal.        Behavior: Behavior normal.        Assessment/Plan: 1. Opacities of both lungs present on chest x-ray Pt needs further diagnostics  - CT Chest High Resolution; Future - CMP14+EGFR - Sed Rate (ESR) - ANA Direct w/Reflex if Positive  2. Gastroesophageal reflux disease without esophagitis Continue Protonix as before   3. Acute cholecystitis Follow up with surgery   4. Hypokalemia Most likely related to diarrhea, will recheck    General Counseling: niki payment understanding of the findings of todays visit and agrees with plan of treatment. I have discussed any further diagnostic evaluation that may be needed or ordered today. We also reviewed her medications today. she has been encouraged to call the office with any questions or concerns that should arise related to todays visit.    Orders Placed This Encounter  Procedures   CT Chest High Resolution   CMP14+EGFR   Sed Rate (ESR)   ANA Direct w/Reflex if Positive    No orders of the defined types were placed in this encounter.   Total time spent:35 Minutes Time spent includes review of chart, medications, test results, and follow up plan with the patient.   [place] Controlled Substance Database was reviewed by me.   Dr Lyndon Code Internal medicine

## 2022-12-15 ENCOUNTER — Telehealth: Payer: Self-pay | Admitting: General Surgery

## 2022-12-15 NOTE — Progress Notes (Signed)
Outpatient Surgical Follow Up CC: Cholelithiasis  12/15/2022  Diana Black is an 57 y.o. female.   Chief Complaint  Patient presents with   Follow-up    HPI: The patient returns to clinic for follow-up of symptomatic cholelithiasis.  In the interim the patient reports that she continues to have epigastric pain and left chest pain after she eats.  She also reports bloating.  The pain does not radiate to the right side or to the back.  She is also concerned that she has diarrhea quickly after she eats.  She had labs that showed that her hyperbilirubinemia resolved and she had an ultrasound that was consistent with cholelithiasis.  She has also been worked up for TB given her recent living in [place] but this was negative.  She is still seeing pulmonology to evaluate 2 spots on her lung.  She has been using a PPI and that has helped with the reflux symptoms that she was having.  Past Medical History:  Diagnosis Date   Environmental allergies    GERD (gastroesophageal reflux disease)     Past Surgical History:  Procedure Laterality Date   CESAREAN SECTION     COLONOSCOPY WITH PROPOFOL N/A 10/08/2022   Procedure: COLONOSCOPY WITH PROPOFOL;  Surgeon: Toney Reil, MD;  Location: [facility] ENDOSCOPY;  Service: Gastroenterology;  Laterality: N/A;    Family History  Problem Relation Age of Onset   Diabetes Mother    Bladder Cancer Mother    Memory loss Mother    Stroke Father     Social History:  reports that she has never smoked. She has never been exposed to tobacco smoke. She has never used smokeless tobacco. She reports current alcohol use. She reports that she does not use drugs.  Allergies: No Known Allergies  Medications reviewed.    ROS Full ROS performed and is otherwise negative other than what is stated in HPI   BP 105/71   Pulse 63   Temp 98.2 F (36.8 C)   Ht 5\' 3"  (1.6 m)   Wt 197 lb (89.4 kg)   LMP  (LMP Unknown)   SpO2 100%   BMI 34.90 kg/m    Physical Exam Abdomen soft, nontender, nondistended Normal work of breathing on room air, PERRLA, moving all extremities spontaneously, normal mood and affect   I personally reviewed her imaging including her ultrasound.  Her ultrasound does show that she has gallstones.  This was also evident on a previous CT chest that she has had.  Her labs have normalized  Assessment/Plan:  The patient is a 57 year old female without any significant past medical history who presented with epigastric and left chest pain after eating as well as diarrhea.  She had a workup there revealed cholelithiasis.  Initially she had a hyperbilirubinemia when seen in the ED but that has resolved.  On her ultrasound there is no dilation of the common bile duct.  The patient and myself talked about her options for treatment.  I did discuss with her that I think she would benefit from a cholecystectomy although her symptoms are not classic biliary colic.  I also discussed that it would be reasonable to not undergo surgery if she can live with the symptoms that she is having.  She is interested in surgical therapy for this.  I discussed the risk, benefits alternatives of the procedure including risk of infection, bleeding, need for open procedure, injury to common bile duct, bile leak and retained stone.  We will plan  to do a robotic cholecystectomy.   Baker Pierini, M.D. [facility]

## 2022-12-15 NOTE — Telephone Encounter (Signed)
Patient calls back, she is now informed of all dates regarding surgery.  

## 2022-12-15 NOTE — Telephone Encounter (Signed)
Left message for patient to call, please inform her of the following regarding scheduled surgery with Dr. Maurine Minister.    Pre-Admission date/time, and Surgery date at [facility].  Surgery Date: 12/31/22 Preadmission Testing Date: 12/23/22 (phone 1p-4p)  Also patient will need to call at [phone], between 1-3:00pm the day before surgery, to find out what time to arrive for surgery.

## 2022-12-17 ENCOUNTER — Ambulatory Visit
Admission: RE | Admit: 2022-12-17 | Discharge: 2022-12-17 | Disposition: A | Discharge status: Home / Self Care | Payer: Federal, State, Local not specified - PPO | Source: Ambulatory Visit | Attending: Internal Medicine | Admitting: Internal Medicine

## 2022-12-17 DIAGNOSIS — K8020 Calculus of gallbladder without cholecystitis without obstruction: Secondary | ICD-10-CM | POA: Diagnosis not present

## 2022-12-17 DIAGNOSIS — R918 Other nonspecific abnormal finding of lung field: Secondary | ICD-10-CM

## 2022-12-23 ENCOUNTER — Encounter
Admission: RE | Admit: 2022-12-23 | Discharge: 2022-12-23 | Disposition: A | Discharge status: Home / Self Care | Payer: Federal, State, Local not specified - PPO | Source: Ambulatory Visit | Attending: General Surgery | Admitting: General Surgery

## 2022-12-23 ENCOUNTER — Ambulatory Visit: Payer: Self-pay | Admitting: General Surgery

## 2022-12-23 DIAGNOSIS — K8050 Calculus of bile duct without cholangitis or cholecystitis without obstruction: Secondary | ICD-10-CM

## 2022-12-23 HISTORY — DX: Plantar fascial fibromatosis: M72.2

## 2022-12-23 HISTORY — DX: Obesity, class 1: E66.811

## 2022-12-23 HISTORY — DX: Other disorders of bilirubin metabolism: E80.6

## 2022-12-23 HISTORY — DX: Vitamin D deficiency, unspecified: E55.9

## 2022-12-23 HISTORY — DX: Mixed hyperlipidemia: E78.2

## 2022-12-23 HISTORY — DX: Calculus of gallbladder without cholecystitis without obstruction: K80.20

## 2022-12-23 HISTORY — DX: Benign and innocent cardiac murmurs: R01.0

## 2022-12-23 NOTE — Patient Instructions (Signed)
Your procedure is scheduled on:12-31-22 Friday Report to the Registration Desk on the 1st floor of the [facility].Then proceed to the 2nd floor Surgery Desk To find out your arrival time, please call [phone] between 1PM - 3PM on:12-30-22 Thursday If your arrival time is 6:00 am, do not arrive before that time as the [facility] entrance doors do not open until 6:00 am.  REMEMBER: Instructions that are not followed completely may result in serious medical risk, up to and including death; or upon the discretion of your surgeon and anesthesiologist your surgery may need to be rescheduled.  Do not eat food OR drink any liquids after midnight the night before surgery.  No gum chewing or hard candies.  One week prior to surgery:Stop NOW (12-23-22) Stop Anti-inflammatories (NSAIDS) such as Advil, Aleve, Ibuprofen, Motrin, Naproxen, Naprosyn and Aspirin based products such as Excedrin, Goody's Powder, BC Powder. Stop ANY OVER THE COUNTER supplements until after surgery (Multivitamin)  You may however, continue to take Tylenol if needed for pain up until the day of surgery  Continue taking all of your other prescription medications up until the day of surgery.  ON THE DAY OF SURGERY ONLY TAKE THESE MEDICATIONS WITH SIPS OF WATER: -pantoprazole (PROTONIX)   No Alcohol for 24 hours before or after surgery.  No Smoking including e-cigarettes for 24 hours before surgery.  No chewable tobacco products for at least 6 hours before surgery.  No nicotine patches on the day of surgery.  Do not use any "recreational" drugs for at least a week (preferably 2 weeks) before your surgery.  Please be advised that the combination of cocaine and anesthesia may have negative outcomes, up to and including death. If you test positive for cocaine, your surgery will be cancelled.  On the morning of surgery brush your teeth with toothpaste and water, you may rinse your mouth with mouthwash if you wish. Do  not swallow any toothpaste or mouthwash.  Use CHG Soap as directed on instruction sheet.  Do not wear jewelry, make-up, hairpins, clips or nail polish.  For welded (permanent) jewelry: bracelets, anklets, waist bands, etc.  Please have this removed prior to surgery.  If it is not removed, there is a chance that hospital personnel will need to cut it off on the day of surgery.  Do not wear lotions, powders, or perfumes.   Do not shave body hair from the neck down 48 hours before surgery.  Contact lenses, hearing aids and dentures may not be worn into surgery.  Do not bring valuables to the hospital. [facility] is not responsible for any missing/lost belongings or valuables.   Notify your doctor if there is any change in your medical condition (cold, fever, infection).  Wear comfortable clothing (specific to your surgery type) to the hospital.  After surgery, you can help prevent lung complications by doing breathing exercises.  Take deep breaths and cough every 1-2 hours. Your doctor may order a device called an Incentive Spirometer to help you take deep breaths. When coughing or sneezing, hold a pillow firmly against your incision with both hands. This is called "splinting." Doing this helps protect your incision. It also decreases belly discomfort.  If you are being admitted to the hospital overnight, leave your suitcase in the car. After surgery it may be brought to your room.  In case of increased patient census, it may be necessary for you, the patient, to continue your postoperative care in the Same Day Surgery department.  If you are being discharged the day of surgery, you will not be allowed to drive home. You will need a responsible individual to drive you home and stay with you for 24 hours after surgery.   If you are taking public transportation, you will need to have a responsible individual with you.  Please call the Pre-admissions Testing Dept. at [phone] if you  have any questions about these instructions.  Surgery Visitation Policy:  Patients having surgery or a procedure may have two visitors.  Children under the age of 77 must have an adult with them who is not the patient.     Preparing for Surgery with CHLORHEXIDINE GLUCONATE (CHG) Soap  Chlorhexidine Gluconate (CHG) Soap  o An antiseptic cleaner that kills germs and bonds with the skin to continue killing germs even after washing  o Used for showering the night before surgery and morning of surgery  Before surgery, you can play an important role by reducing the number of germs on your skin.  CHG (Chlorhexidine gluconate) soap is an antiseptic cleanser which kills germs and bonds with the skin to continue killing germs even after washing.  Please do not use if you have an allergy to CHG or antibacterial soaps. If your skin becomes reddened/irritated stop using the CHG.  1. Shower the NIGHT BEFORE SURGERY and the MORNING OF SURGERY with CHG soap.  2. If you choose to wash your hair, wash your hair first as usual with your normal shampoo.  3. After shampooing, rinse your hair and body thoroughly to remove the shampoo.  4. Use CHG as you would any other liquid soap. You can apply CHG directly to the skin and wash gently with a scrungie or a clean washcloth.  5. Apply the CHG soap to your body only from the neck down. Do not use on open wounds or open sores. Avoid contact with your eyes, ears, mouth, and genitals (private parts). Wash face and genitals (private parts) with your normal soap.  6. Wash thoroughly, paying special attention to the area where your surgery will be performed.  7. Thoroughly rinse your body with warm water.  8. Do not shower/wash with your normal soap after using and rinsing off the CHG soap.  9. Pat yourself dry with a clean towel.  10. Wear clean pajamas to bed the night before surgery.  12. Place clean sheets on your bed the night of your first shower  and do not sleep with pets.  13. Shower again with the CHG soap on the day of surgery prior to arriving at the hospital.  14. Do not apply any deodorants/lotions/powders.  15. Please wear clean clothes to the hospital.

## 2022-12-27 DIAGNOSIS — R918 Other nonspecific abnormal finding of lung field: Secondary | ICD-10-CM | POA: Diagnosis not present

## 2022-12-28 LAB — CMP14+EGFR
ALT: 17 [IU]/L (ref 0–32)
AST: 24 [IU]/L (ref 0–40)
Albumin: 4.4 g/dL (ref 3.8–4.9)
Alkaline Phosphatase: 110 IU/L (ref 44–121)
BUN/Creatinine Ratio: 13 (ref 9–23)
BUN: 11 mg/dL (ref 6–24)
Bilirubin Total: 0.4 mg/dL (ref 0.0–1.2)
CO2: 25 mmol/L (ref 20–29)
Calcium: 9.3 mg/dL (ref 8.7–10.2)
Chloride: 104 mmol/L (ref 96–106)
Creatinine, Ser: 0.83 mg/dL (ref 0.57–1.00)
Globulin, Total: 2.4 g/dL (ref 1.5–4.5)
Glucose: 94 mg/dL (ref 70–99)
Potassium: 4.4 mmol/L (ref 3.5–5.2)
Sodium: 141 mmol/L (ref 134–144)
Total Protein: 6.8 g/dL (ref 6.0–8.5)
eGFR: 83 mL/min/{1.73_m2} (ref >59)

## 2022-12-28 LAB — ANA W/REFLEX IF POSITIVE: Anti Nuclear Antibody (ANA): NEGATIVE

## 2022-12-28 LAB — SEDIMENTATION RATE: Sed Rate: 7 mm/h (ref 0–40)

## 2022-12-30 MED ORDER — SODIUM CHLORIDE 0.9 % IV SOLN
2.0000 g | INTRAVENOUS | Status: AC
  Administered 2022-12-31: 2 g via INTRAVENOUS

## 2022-12-30 MED ORDER — INDOCYANINE GREEN 25 MG IV SOLR
1.2500 mg | Freq: Once | INTRAVENOUS | Status: AC
Start: 2022-12-30 — End: 2022-12-31
  Administered 2022-12-31: 1.25 mg via INTRAVENOUS

## 2022-12-30 MED ORDER — CHLORHEXIDINE GLUCONATE CLOTH 2 % EX PADS
6.0000 | MEDICATED_PAD | Freq: Once | CUTANEOUS | Status: DC
Start: 2022-12-30 — End: 2022-12-31

## 2022-12-30 MED ORDER — LACTATED RINGERS IV SOLN
INTRAVENOUS | Status: DC
Start: 2022-12-30 — End: 2022-12-31

## 2022-12-30 MED ORDER — ORAL CARE MOUTH RINSE: 15.0000 mL | Freq: Once | OROMUCOSAL | Status: AC

## 2022-12-30 MED ORDER — CHLORHEXIDINE GLUCONATE 0.12 % MT SOLN
15.0000 mL | Freq: Once | OROMUCOSAL | Status: AC
  Administered 2022-12-31: 15 mL via OROMUCOSAL

## 2022-12-31 ENCOUNTER — Encounter
Admission: RE | Disposition: A | Discharge status: Home / Self Care | Payer: Self-pay | Source: Home / Self Care | Attending: General Surgery

## 2022-12-31 ENCOUNTER — Other Ambulatory Visit: Payer: Self-pay

## 2022-12-31 ENCOUNTER — Ambulatory Visit
Admission: RE | Admit: 2022-12-31 | Discharge: 2022-12-31 | Disposition: A | Discharge status: Home / Self Care | Payer: Federal, State, Local not specified - PPO | Attending: General Surgery | Admitting: General Surgery

## 2022-12-31 ENCOUNTER — Ambulatory Visit: Payer: Federal, State, Local not specified - PPO | Admitting: Anesthesiology

## 2022-12-31 ENCOUNTER — Encounter: Payer: Self-pay | Admitting: General Surgery

## 2022-12-31 DIAGNOSIS — Z79899 Other long term (current) drug therapy: Secondary | ICD-10-CM | POA: Insufficient documentation

## 2022-12-31 DIAGNOSIS — K8050 Calculus of bile duct without cholangitis or cholecystitis without obstruction: Secondary | ICD-10-CM

## 2022-12-31 DIAGNOSIS — K8010 Calculus of gallbladder with chronic cholecystitis without obstruction: Secondary | ICD-10-CM | POA: Insufficient documentation

## 2022-12-31 DIAGNOSIS — K8000 Calculus of gallbladder with acute cholecystitis without obstruction: Secondary | ICD-10-CM | POA: Diagnosis not present

## 2022-12-31 DIAGNOSIS — K8020 Calculus of gallbladder without cholecystitis without obstruction: Secondary | ICD-10-CM

## 2022-12-31 DIAGNOSIS — K219 Gastro-esophageal reflux disease without esophagitis: Secondary | ICD-10-CM | POA: Insufficient documentation

## 2022-12-31 SURGERY — CHOLECYSTECTOMY, ROBOT-ASSISTED, LAPAROSCOPIC
Anesthesia: General

## 2022-12-31 MED ORDER — DEXAMETHASONE SODIUM PHOSPHATE 10 MG/ML IJ SOLN
INTRAMUSCULAR | Status: AC
Start: 2022-12-31 — End: ?
  Filled 2022-12-31: qty 1

## 2022-12-31 MED ORDER — OXYCODONE HCL 5 MG PO TABS
5.0000 mg | ORAL_TABLET | Freq: Once | ORAL | Status: AC | PRN
  Administered 2022-12-31: 5 mg via ORAL

## 2022-12-31 MED ORDER — EPHEDRINE SULFATE-NACL 50-0.9 MG/10ML-% IV SOSY
PREFILLED_SYRINGE | INTRAVENOUS | Status: DC | PRN
  Administered 2022-12-31: 10 mg via INTRAVENOUS

## 2022-12-31 MED ORDER — ACETAMINOPHEN 10 MG/ML IV SOLN
INTRAVENOUS | Status: AC
  Filled 2022-12-31: qty 100

## 2022-12-31 MED ORDER — OXYCODONE HCL 5 MG/5ML PO SOLN: 5.0000 mg | Freq: Once | ORAL | Status: AC | PRN

## 2022-12-31 MED ORDER — HYDROMORPHONE HCL 1 MG/ML IJ SOLN
INTRAMUSCULAR | Status: AC
  Filled 2022-12-31: qty 1

## 2022-12-31 MED ORDER — KETOROLAC TROMETHAMINE 30 MG/ML IJ SOLN
INTRAMUSCULAR | Status: DC | PRN
  Administered 2022-12-31: 30 mg via INTRAVENOUS

## 2022-12-31 MED ORDER — OXYCODONE HCL 5 MG PO TABS
ORAL_TABLET | ORAL | Status: AC
  Filled 2022-12-31: qty 1

## 2022-12-31 MED ORDER — SEVOFLURANE IN SOLN
RESPIRATORY_TRACT | Status: AC
  Filled 2022-12-31: qty 250

## 2022-12-31 MED ORDER — BUPIVACAINE LIPOSOME 1.3 % IJ SUSP
INTRAMUSCULAR | Status: AC
Start: 2022-12-31 — End: ?
  Filled 2022-12-31: qty 20

## 2022-12-31 MED ORDER — PROPOFOL 10 MG/ML IV BOLUS
INTRAVENOUS | Status: DC | PRN
  Administered 2022-12-31: 50 ug/kg/min via INTRAVENOUS

## 2022-12-31 MED ORDER — FENTANYL CITRATE (PF) 100 MCG/2ML IJ SOLN
INTRAMUSCULAR | Status: DC | PRN
  Administered 2022-12-31 (×3): 50 ug via INTRAVENOUS

## 2022-12-31 MED ORDER — ROCURONIUM BROMIDE 10 MG/ML (PF) SYRINGE
PREFILLED_SYRINGE | INTRAVENOUS | Status: AC
  Filled 2022-12-31: qty 10

## 2022-12-31 MED ORDER — CHLORHEXIDINE GLUCONATE 0.12 % MT SOLN
OROMUCOSAL | Status: AC
  Filled 2022-12-31: qty 15

## 2022-12-31 MED ORDER — KETOROLAC TROMETHAMINE 30 MG/ML IJ SOLN
INTRAMUSCULAR | Status: AC
  Filled 2022-12-31: qty 1

## 2022-12-31 MED ORDER — PROPOFOL 10 MG/ML IV BOLUS
INTRAVENOUS | Status: AC
  Filled 2022-12-31: qty 40

## 2022-12-31 MED ORDER — HYDROMORPHONE HCL 1 MG/ML IJ SOLN
0.2500 mg | INTRAMUSCULAR | Status: DC | PRN
  Administered 2022-12-31: 0.25 mg via INTRAVENOUS
  Administered 2022-12-31: 0.5 mg via INTRAVENOUS

## 2022-12-31 MED ORDER — BUPIVACAINE-EPINEPHRINE (PF) 0.5% -1:200000 IJ SOLN
INTRAMUSCULAR | Status: AC
  Filled 2022-12-31: qty 10

## 2022-12-31 MED ORDER — LIDOCAINE HCL (PF) 2 % IJ SOLN
INTRAMUSCULAR | Status: AC
  Filled 2022-12-31: qty 5

## 2022-12-31 MED ORDER — SODIUM CHLORIDE 0.9 % IV SOLN
INTRAVENOUS | Status: AC
  Filled 2022-12-31: qty 2

## 2022-12-31 MED ORDER — BUPIVACAINE LIPOSOME 1.3 % IJ SUSP
INTRAMUSCULAR | Status: DC | PRN
  Administered 2022-12-31: 20 mL

## 2022-12-31 MED ORDER — OXYCODONE HCL 5 MG PO TABS: 5.0000 mg | ORAL_TABLET | Freq: Four times a day (QID) | ORAL | 0 refills | Status: DC | PRN

## 2022-12-31 MED ORDER — MIDAZOLAM HCL 2 MG/2ML IJ SOLN
INTRAMUSCULAR | Status: AC
Start: 2022-12-31 — End: ?
  Filled 2022-12-31: qty 2

## 2022-12-31 MED ORDER — ACETAMINOPHEN 10 MG/ML IV SOLN
INTRAVENOUS | Status: DC | PRN
  Administered 2022-12-31: 1000 mg via INTRAVENOUS

## 2022-12-31 MED ORDER — INDOCYANINE GREEN 25 MG IV SOLR
INTRAVENOUS | Status: AC
  Filled 2022-12-31: qty 10

## 2022-12-31 MED ORDER — DEXAMETHASONE SODIUM PHOSPHATE 10 MG/ML IJ SOLN
INTRAMUSCULAR | Status: DC | PRN
  Administered 2022-12-31: 10 mg via INTRAVENOUS

## 2022-12-31 MED ORDER — SUGAMMADEX SODIUM 200 MG/2ML IV SOLN
INTRAVENOUS | Status: DC | PRN
  Administered 2022-12-31: 200 mg via INTRAVENOUS

## 2022-12-31 MED ORDER — LIDOCAINE HCL (CARDIAC) PF 100 MG/5ML IV SOSY
PREFILLED_SYRINGE | INTRAVENOUS | Status: DC | PRN
  Administered 2022-12-31: 100 mg via INTRAVENOUS

## 2022-12-31 MED ORDER — DROPERIDOL 2.5 MG/ML IJ SOLN
INTRAMUSCULAR | Status: AC
  Filled 2022-12-31: qty 2

## 2022-12-31 MED ORDER — DROPERIDOL 2.5 MG/ML IJ SOLN
0.6250 mg | Freq: Once | INTRAMUSCULAR | Status: AC
  Administered 2022-12-31: 0.625 mg via INTRAVENOUS

## 2022-12-31 MED ORDER — FENTANYL CITRATE (PF) 250 MCG/5ML IJ SOLN
INTRAMUSCULAR | Status: AC
  Filled 2022-12-31: qty 5

## 2022-12-31 MED ORDER — MIDAZOLAM HCL 2 MG/2ML IJ SOLN
INTRAMUSCULAR | Status: DC | PRN
  Administered 2022-12-31: 2 mg via INTRAVENOUS

## 2022-12-31 MED ORDER — ROCURONIUM BROMIDE 100 MG/10ML IV SOLN
INTRAVENOUS | Status: DC | PRN
  Administered 2022-12-31: 60 mg via INTRAVENOUS

## 2022-12-31 MED ORDER — BUPIVACAINE-EPINEPHRINE (PF) 0.5% -1:200000 IJ SOLN
INTRAMUSCULAR | Status: DC | PRN
  Administered 2022-12-31: 10 mL

## 2022-12-31 MED ORDER — ONDANSETRON HCL 4 MG/2ML IJ SOLN
INTRAMUSCULAR | Status: AC
Start: 2022-12-31 — End: ?
  Filled 2022-12-31: qty 2

## 2022-12-31 MED ORDER — ONDANSETRON HCL 4 MG/2ML IJ SOLN
INTRAMUSCULAR | Status: DC | PRN
  Administered 2022-12-31: 4 mg via INTRAVENOUS

## 2022-12-31 SURGICAL SUPPLY — 43 items
BAG PRESSURE INF REUSE 1000 (BAG) IMPLANT
CANNULA REDUCER 12-8 DVNC XI (CANNULA) ×1 IMPLANT
CAUTERY HOOK MNPLR 1.6 DVNC XI (INSTRUMENTS) ×1 IMPLANT
CLIP LIGATING HEMO O LOK GREEN (MISCELLANEOUS) ×1 IMPLANT
DERMABOND ADVANCED .7 DNX12 (GAUZE/BANDAGES/DRESSINGS) ×1 IMPLANT
DRAPE ARM DVNC X/XI (DISPOSABLE) ×4 IMPLANT
DRAPE COLUMN DVNC XI (DISPOSABLE) ×1 IMPLANT
ELECT REM PT RETURN 9FT ADLT (ELECTROSURGICAL) ×1
ELECTRODE REM PT RTRN 9FT ADLT (ELECTROSURGICAL) ×1 IMPLANT
FORCEPS BPLR 8 MD DVNC XI (FORCEP) IMPLANT
FORCEPS BPLR R/ABLATION 8 DVNC (INSTRUMENTS) ×1 IMPLANT
FORCEPS PROGRASP DVNC XI (FORCEP) ×1 IMPLANT
GLOVE BIOGEL PI IND STRL 7.5 (GLOVE) ×2 IMPLANT
GLOVE SURG SYN 7.0 (GLOVE) ×6 IMPLANT
GLOVE SURG SYN 7.0 PF PI (GLOVE) ×2 IMPLANT
GOWN STRL REUS W/ TWL LRG LVL3 (GOWN DISPOSABLE) ×3 IMPLANT
GRASPER SUT TROCAR 14GX15 (MISCELLANEOUS) ×1 IMPLANT
IRRIGATOR SUCT 8 DISP DVNC XI (IRRIGATION / IRRIGATOR) IMPLANT
IV NS 1000ML BAXH (IV SOLUTION) IMPLANT
KIT PINK PAD W/HEAD ARE REST (MISCELLANEOUS) ×1 IMPLANT
KIT PINK PAD W/HEAD ARM REST (MISCELLANEOUS) ×1 IMPLANT
LABEL OR SOLS (LABEL) ×1 IMPLANT
MANIFOLD NEPTUNE II (INSTRUMENTS) ×1 IMPLANT
NDL HYPO 22X1.5 SAFETY MO (MISCELLANEOUS) ×1 IMPLANT
NDL INSUFFLATION 14GA 120MM (NEEDLE) ×1 IMPLANT
NEEDLE HYPO 22X1.5 SAFETY MO (MISCELLANEOUS) ×1 IMPLANT
NEEDLE INSUFFLATION 14GA 120MM (NEEDLE) ×1 IMPLANT
NS IRRIG 500ML POUR BTL (IV SOLUTION) ×1 IMPLANT
OBTURATOR OPTICAL STND 8 DVNC (TROCAR) ×1
OBTURATOR OPTICALSTD 8 DVNC (TROCAR) ×1 IMPLANT
PACK LAP CHOLECYSTECTOMY (MISCELLANEOUS) ×1 IMPLANT
SEAL UNIV 5-12 XI (MISCELLANEOUS) ×4 IMPLANT
SET TUBE SMOKE EVAC HIGH FLOW (TUBING) ×1 IMPLANT
SOL ELECTROSURG ANTI STICK (MISCELLANEOUS) ×1
SOLUTION ELECTROSURG ANTI STCK (MISCELLANEOUS) ×1 IMPLANT
SPIKE FLUID TRANSFER (MISCELLANEOUS) ×2 IMPLANT
SPONGE T-LAP 4X18 ~~LOC~~+RFID (SPONGE) IMPLANT
SUT MNCRL 4-0 27XMFL (SUTURE) ×1
SUT VICRYL 0 UR6 27IN ABS (SUTURE) ×1 IMPLANT
SUTURE MNCRL 4-0 27XMF (SUTURE) ×1 IMPLANT
SYS BAG RETRIEVAL 10MM (BASKET) ×1
SYSTEM BAG RETRIEVAL 10MM (BASKET) ×1 IMPLANT
WATER STERILE IRR 500ML POUR (IV SOLUTION) ×1 IMPLANT

## 2022-12-31 NOTE — Anesthesia Postprocedure Evaluation (Signed)
Anesthesia Post Note  Patient: ADRITA MEYERHOFF  Procedure(s) Performed: XI ROBOTIC ASSISTED LAPAROSCOPIC CHOLECYSTECTOMY INDOCYANINE GREEN FLUORESCENCE IMAGING (ICG)  Patient location during evaluation: PACU Anesthesia Type: General Level of consciousness: awake and alert Pain management: pain level controlled Vital Signs Assessment: post-procedure vital signs reviewed and stable Respiratory status: spontaneous breathing, nonlabored ventilation, respiratory function stable and patient connected to nasal cannula oxygen Cardiovascular status: blood pressure returned to baseline and stable Postop Assessment: no apparent nausea or vomiting Anesthetic complications: no   There were no known notable events for this encounter.   Last Vitals:  Vitals:   12/31/22 1147 12/31/22 1200  BP: (!) 99/57 111/69  Pulse: 61 (!) 54  Resp: 19   Temp: (!) 36.1 C 37.2 C  SpO2: 97% 98%    Last Pain:  Vitals:   12/31/22 1200  TempSrc: Temporal  PainSc: 4                  Louie Boston

## 2022-12-31 NOTE — Op Note (Signed)
Robotic assisted laparoscopic Cholecystectomy  Pre-operative Diagnosis: Symptomatic Cholelithiasis  Post-operative Diagnosis: Same  Procedure:  Robotic assisted laparoscopic Cholecystectomy  Surgeon: Baker Pierini, MD  Anesthesia: Gen. with endotracheal tube  Findings: Distended gallbladder, filled with stones and bile  Estimated Blood Loss: 10cc       Specimens: Gallbladder           Complications: none   Procedure Details  The patient was seen again in the Holding Room. The benefits, complications, treatment options, and expected outcomes were discussed with the patient. The risks of bleeding, infection, recurrence of symptoms, failure to resolve symptoms, bile duct damage, bile duct leak, retained common bile duct stone, bowel injury, any of which could require further surgery and/or ERCP, stent, or papillotomy were reviewed with the patient. The likelihood of improving the patient's symptoms with return to their baseline status is good.  The patient and/or family concurred with the proposed plan, giving informed consent.  The patient was taken to Operating Room, identified  and the procedure verified as robotic Cholecystectomy.  A Time Out was held and the above information confirmed.  Prior to the induction of general anesthesia, antibiotic prophylaxis was administered. VTE prophylaxis was in place. General endotracheal anesthesia was then administered and tolerated well. After the induction, the abdomen was prepped with Chloraprep and draped in the sterile fashion. The patient was positioned in the supine position.  A veress needle was inserted into the abdomen using standard drop technique. An 8mm infra-umbilical robotic port was then placed under direct visualization. There was no injury noted at the site of veress needle insertion. Two right sided abdominal 8mm ports followed by an 8mm left abdominal robotic ports were placed under direct visualization. The left sided abdominal  port was then upsized to a 12mm robotic port.  The patient was positioned  in reverse Trendelenburg, robot was brought to the surgical field and docked in the standard fashion.  We made sure all the instrumentation was kept indirect view at all times and that there were no collision between the arms. I scrubbed out and went to the console.  The gallbladder was identified, the fundus grasped and retracted cephalad. Adhesions were lysed bluntly. The infundibulum was grasped and retracted laterally, exposing the peritoneum overlying the triangle of Calot. This was then divided and exposed in a blunt fashion. An extended critical view of the cystic duct and cystic artery was obtained.  The cystic duct was clearly identified and bluntly dissected.   Artery and duct were double clipped and divided. Using ICG cholangiography we visualized the cystic duct. The gallbladder was taken from the gallbladder fossa in a retrograde fashion with the electrocautery.  Hemostasis was achieved with the electrocautery. nspection of the right upper quadrant was performed. No bleeding, bile duct injury or leak, or bowel injury was noted. Robotic instruments and robotic arms were undocked in the standard fashion.  I scrubbed back in.  The gallbladder was removed and placed in an Endocatch bag.   The left lower quadrant fascia was then closed with a 0 vicryl using a suture needle passer. The pre-peritoneal space was then infiltrated with liposomal bupivicaine and marcaine solution. Pneumoperitoneum was released.  4-0 subcuticular Monocryl was used to close the skin. Dermabond was  applied.  The patient was then extubated and brought to the recovery room in stable condition. Sponge, lap, and needle counts were correct at closure and at the conclusion of the case.  Baker Pierini, M.D. [facility]

## 2022-12-31 NOTE — Anesthesia Procedure Notes (Signed)
Procedure Name: Intubation Date/Time: 12/31/2022 9:10 AM  Performed by: Lysbeth Penner, CRNAPre-anesthesia Checklist: Patient identified, Emergency Drugs available, Suction available and Patient being monitored Patient Re-evaluated:Patient Re-evaluated prior to induction Oxygen Delivery Method: Circle system utilized Preoxygenation: Pre-oxygenation with 100% oxygen Induction Type: IV induction Ventilation: Mask ventilation without difficulty Laryngoscope Size: McGrath and 3 Grade View: Grade I Tube type: Oral Tube size: 6.5 mm Number of attempts: 1 Airway Equipment and Method: Stylet and Oral airway Placement Confirmation: ETT inserted through vocal cords under direct vision, positive ETCO2 and breath sounds checked- equal and bilateral Secured at: 20 cm Tube secured with: Tape Dental Injury: Teeth and Oropharynx as per pre-operative assessment

## 2022-12-31 NOTE — H&P (Signed)
No changes to below H&P.  Discussed risk, benefits alternatives of the patient and she agrees to proceed.  Proceed with robotic assisted cholecystectomy   Expand All Collapse All  Outpatient Surgical Follow Up CC: Cholelithiasis  12/15/2022   Diana Black is an 57 y.o. female.       Chief Complaint  Patient presents with   Follow-up      HPI: The patient returns to clinic for follow-up of symptomatic cholelithiasis.  In the interim the patient reports that she continues to have epigastric pain and left chest pain after she eats.  She also reports bloating.  The pain does not radiate to the right side or to the back.  She is also concerned that she has diarrhea quickly after she eats.  She had labs that showed that her hyperbilirubinemia resolved and she had an ultrasound that was consistent with cholelithiasis.  She has also been worked up for TB given her recent living in [place] but this was negative.  She is still seeing pulmonology to evaluate 2 spots on her lung.  She has been using a PPI and that has helped with the reflux symptoms that she was having.       Past Medical History:  Diagnosis Date   Environmental allergies     GERD (gastroesophageal reflux disease)                 Past Surgical History:  Procedure Laterality Date   CESAREAN SECTION       COLONOSCOPY WITH PROPOFOL N/A 10/08/2022    Procedure: COLONOSCOPY WITH PROPOFOL;  Surgeon: Toney Reil, MD;  [facility] ENDOSCOPY;  Service: Gastroenterology;  Laterality: N/A;               Family History  Problem Relation Age of Onset   Diabetes Mother     Bladder Cancer Mother     Memory loss Mother     Stroke Father            Social History:  reports that she has never smoked. She has never been exposed to tobacco smoke. She has never used smokeless tobacco. She reports current alcohol use. She reports that she does not use drugs.   Allergies:  Allergies  No Known Allergies      Medications reviewed.       ROS Full ROS performed and is otherwise negative other than what is stated in HPI     BP 105/71   Pulse 63   Temp 98.2 F (36.8 C)   Ht 5\' 3"  (1.6 m)   Wt 197 lb (89.4 kg)   LMP  (LMP Unknown)   SpO2 100%   BMI 34.90 kg/m    Physical Exam Abdomen soft, nontender, nondistended Normal work of breathing on room air, PERRLA, moving all extremities spontaneously, normal mood and affect     I personally reviewed her imaging including her ultrasound.  Her ultrasound does show that she has gallstones.  This was also evident on a previous CT chest that she has had.  Her labs have normalized   Assessment/Plan:   The patient is a 57 year old female without any significant past medical history who presented with epigastric and left chest pain after eating as well as diarrhea.  She had a workup there revealed cholelithiasis.  Initially she had a hyperbilirubinemia when seen in the ED but that has resolved.  On her ultrasound there is no dilation of the common bile duct.  The patient  and myself talked about her options for treatment.  I did discuss with her that I think she would benefit from a cholecystectomy although her symptoms are not classic biliary colic.  I also discussed that it would be reasonable to not undergo surgery if she can live with the symptoms that she is having.  She is interested in surgical therapy for this.  I discussed the risk, benefits alternatives of the procedure including risk of infection, bleeding, need for open procedure, injury to common bile duct, bile leak and retained stone.  We will plan to do a robotic cholecystectomy.

## 2022-12-31 NOTE — Transfer of Care (Signed)
Immediate Anesthesia Transfer of Care Note  Patient: Diana Black  Procedure(s) Performed: XI ROBOTIC ASSISTED LAPAROSCOPIC CHOLECYSTECTOMY INDOCYANINE GREEN FLUORESCENCE IMAGING (ICG)  Patient Location: PACU  Anesthesia Type:General  Level of Consciousness: drowsy  Airway & Oxygen Therapy: Patient Spontanous Breathing  Post-op Assessment: Report given to RN and Post -op Vital signs reviewed and stable  Post vital signs: Reviewed and stable  Last Vitals:  Vitals Value Taken Time  BP 104/56 12/31/22 1038  Temp 36.7 C 12/31/22 1038  Pulse 63 12/31/22 1040  Resp 12 12/31/22 1040  SpO2 100 % 12/31/22 1040  Vitals shown include unfiled device data.  Last Pain:  Vitals:   12/31/22 1038  PainSc: 0-No pain         Complications: There were no known notable events for this encounter.

## 2022-12-31 NOTE — Anesthesia Preprocedure Evaluation (Signed)
Anesthesia Evaluation  Patient identified by MRN, date of birth, ID band Patient awake    Reviewed: Allergy & Precautions, NPO status , Patient's Chart, lab work & pertinent test results  History of Anesthesia Complications Negative for: history of anesthetic complications  Airway Mallampati: III  TM Distance: >3 FB Neck ROM: full    Dental no notable dental hx.    Pulmonary neg pulmonary ROS   Pulmonary exam normal        Cardiovascular negative cardio ROS Normal cardiovascular exam     Neuro/Psych  PSYCHIATRIC DISORDERS Anxiety     negative neurological ROS     GI/Hepatic Neg liver ROS,GERD  ,,  Endo/Other  negative endocrine ROS    Renal/GU      Musculoskeletal   Abdominal   Peds  Hematology negative hematology ROS (+)   Anesthesia Other Findings Past Medical History: No date: Benign heart murmur No date: Environmental allergies No date: GERD (gastroesophageal reflux disease) No date: Hyperbilirubinemia No date: Mixed hyperlipidemia No date: Obesity (BMI 30.0-34.9) No date: Plantar fasciitis No date: Symptomatic cholelithiasis No date: Vitamin D deficiency  Past Surgical History: No date: CESAREAN SECTION     Comment:  x2 10/08/2022: COLONOSCOPY WITH PROPOFOL; N/A     Comment:  Procedure: COLONOSCOPY WITH PROPOFOL;  Surgeon: Toney Reil, MD;  Location: [facility] ENDOSCOPY;  Service:               Gastroenterology;  Laterality: N/A;     Reproductive/Obstetrics negative OB ROS                             Anesthesia Physical Anesthesia Plan  ASA: 2  Anesthesia Plan: General ETT   Post-op Pain Management: Toradol IV (intra-op)*, Ofirmev IV (intra-op)* and Dilaudid IV   Induction: Intravenous  PONV Risk Score and Plan: 3 and Ondansetron, Dexamethasone, Midazolam and Treatment may vary due to age or medical condition  Airway Management Planned: Oral  ETT  Additional Equipment:   Intra-op Plan:   Post-operative Plan: Extubation in OR  Informed Consent: I have reviewed the patients History and Physical, chart, labs and discussed the procedure including the risks, benefits and alternatives for the proposed anesthesia with the patient or authorized representative who has indicated his/her understanding and acceptance.     Dental Advisory Given  Plan Discussed with: Anesthesiologist, CRNA and Surgeon  Anesthesia Plan Comments: (Patient consented for risks of anesthesia including but not limited to:  - adverse reactions to medications - damage to eyes, teeth, lips or other oral mucosa - nerve damage due to positioning  - sore throat or hoarseness - Damage to heart, brain, nerves, lungs, other parts of body or loss of life  Patient voiced understanding and assent.)        Anesthesia Quick Evaluation

## 2023-01-03 ENCOUNTER — Ambulatory Visit: Payer: Federal, State, Local not specified - PPO | Admitting: Internal Medicine

## 2023-01-03 ENCOUNTER — Encounter: Payer: Self-pay | Admitting: Internal Medicine

## 2023-01-03 VITALS — BP 113/78 | HR 66 | Temp 98.5°F | Resp 16 | Ht 63.0 in | Wt 196.8 lb

## 2023-01-03 DIAGNOSIS — K219 Gastro-esophageal reflux disease without esophagitis: Secondary | ICD-10-CM | POA: Diagnosis not present

## 2023-01-03 DIAGNOSIS — R918 Other nonspecific abnormal finding of lung field: Secondary | ICD-10-CM | POA: Diagnosis not present

## 2023-01-03 LAB — SURGICAL PATHOLOGY

## 2023-01-03 MED ORDER — OMEPRAZOLE 40 MG PO CPDR: 40.0000 mg | DELAYED_RELEASE_CAPSULE | Freq: Every day | ORAL | 3 refills | Status: DC

## 2023-01-03 NOTE — Progress Notes (Signed)
[facility] [address] [place], [place] [address]  Pulmonary Sleep Medicine   Office Visit Note  Patient Name: Diana Black DOB: February 27, 1965 MRN [id number]  Date of Service: 01/03/2023  Complaints/HPI: She was seen in the ER for GB issues. She staets she had a routine CXR done which showed some spots. She was working in [place] for a while. She has had a PPD and also has had a quanteferon. She states she worked in [employer]. She did have exposure with people with MTB in the past. She had a CT chest in October shows some areas of basilar atelectasis. She at the time was having pain recalls splinting to breath. In Novemebr she had another scan and although there is not a final report it appears to be clear. No nodules and no atelectasis  Office Spirometry Results:     ROS  General: (-) fever, (-) chills, (-) night sweats, (-) weakness Skin: (-) rashes, (-) itching,. Eyes: (-) visual changes, (-) redness, (-) itching. Nose and Sinuses: (-) nasal stuffiness or itchiness, (-) postnasal drip, (-) nosebleeds, (-) sinus trouble. Mouth and Throat: (-) sore throat, (-) hoarseness. Neck: (-) swollen glands, (-) enlarged thyroid, (-) neck pain. Respiratory: - cough, (-) bloody sputum, - shortness of breath, - wheezing. Cardiovascular: - ankle swelling, (-) chest pain. Lymphatic: (-) lymph node enlargement. Neurologic: (-) numbness, (-) tingling. Psychiatric: (-) anxiety, (-) depression   Current Medication: Outpatient Encounter Medications as of 01/03/2023  Medication Sig   latanoprost (XALATAN) 0.005 % ophthalmic solution Place 1 drop into both eyes at bedtime.   Multiple Vitamins-Minerals (MULTIVITAMIN WITH MINERALS) tablet Take 1 tablet by mouth daily. +Calcium 500 mg   oxyCODONE (OXY IR/ROXICODONE) 5 MG immediate release tablet Take 1 tablet (5 mg total) by mouth every 6 (six) hours as needed for severe pain (pain score 7-10).   pantoprazole (PROTONIX) 40 MG  tablet Take 1 tablet (40 mg total) by mouth daily. (Patient taking differently: Take 40 mg by mouth every morning.)   No facility-administered encounter medications on file as of 01/03/2023.    Surgical History: Past Surgical History:  Procedure Laterality Date   CESAREAN SECTION     x2   COLONOSCOPY WITH PROPOFOL N/A 10/08/2022   Procedure: COLONOSCOPY WITH PROPOFOL;  Surgeon: Toney Reil, MD;  Location: [facility] ENDOSCOPY;  Service: Gastroenterology;  Laterality: N/A;    Medical History: Past Medical History:  Diagnosis Date   Benign heart murmur    Environmental allergies    GERD (gastroesophageal reflux disease)    Hyperbilirubinemia    Mixed hyperlipidemia    Obesity (BMI 30.0-34.9)    Plantar fasciitis    Symptomatic cholelithiasis    Vitamin D deficiency     Family History: Family History  Problem Relation Age of Onset   Diabetes Mother    Bladder Cancer Mother    Memory loss Mother    Stroke Father     Social History: Social History   Socioeconomic History   Marital status: Married    Spouse name: Not on file   Number of children: Not on file   Years of education: Not on file   Highest education level: Not on file  Occupational History   Not on file  Tobacco Use   Smoking status: Never    Passive exposure: Never   Smokeless tobacco: Never  Vaping Use   Vaping status: Never Used  Substance and Sexual Activity   Alcohol use: Yes    Comment:  occ   Drug use: Never   Sexual activity: Yes  Other Topics Concern   Not on file  Social History Narrative   Not on file   Social Determinants of Health   Financial Resource Strain: Not on file  Food Insecurity: Not on file  Transportation Needs: Not on file  Physical Activity: Not on file  Stress: Not on file  Social Connections: Not on file  Intimate Partner Violence: Not on file    Vital Signs: Blood pressure 113/78, pulse 66, temperature 98.5 F (36.9 C), resp. rate 16, height 5\' 3"  (1.6  m), weight 196 lb 12.8 oz (89.3 kg), SpO2 98%.  Examination: General Appearance: The patient is well-developed, well-nourished, and in no distress. Skin: Gross inspection of skin unremarkable. Head: normocephalic, no gross deformities. Eyes: no gross deformities noted. ENT: ears appear grossly normal no exudates. Neck: Supple. No thyromegaly. No LAD. Respiratory: no rhonchi noted. Cardiovascular: Normal S1 and S2 without murmur or rub. Extremities: No cyanosis. pulses are equal. Neurologic: Alert and oriented. No involuntary movements.  LABS: Recent Results (from the past 2160 hour(s))  Basic metabolic panel     Status: Abnormal   Collection Time: 11/10/22  7:31 PM  Result Value Ref Range   Sodium 138 135 - 145 mmol/L   Potassium 3.9 3.5 - 5.1 mmol/L   Chloride 105 98 - 111 mmol/L   CO2 22 22 - 32 mmol/L   Glucose, Bld 92 70 - 99 mg/dL    Comment: Glucose reference range applies only to samples taken after fasting for at least 8 hours.   BUN 12 6 - 20 mg/dL   Creatinine, Ser 1.61 0.44 - 1.00 mg/dL   Calcium 8.8 (L) 8.9 - 10.3 mg/dL   GFR, Estimated >09 >60 mL/min    Comment: (NOTE) Calculated using the CKD-EPI Creatinine Equation (2021)    Anion gap 11 5 - 15    Comment: Performed at [facility], [address], [place], [place] [address]  Troponin I (High Sensitivity)     Status: None   Collection Time: 11/10/22  7:31 PM  Result Value Ref Range   Troponin I (High Sensitivity) <2 <18 ng/L    Comment: (NOTE) Elevated high sensitivity troponin I (hsTnI) values and significant  changes across serial measurements may suggest ACS but many other  chronic and acute conditions are known to elevate hsTnI results.  Refer to the "Links" section for chest pain algorithms and additional  guidance. Performed at [facility], [address] [place], [place] [address]   CBC with Differential     Status: None   Collection Time: 11/10/22  7:31 PM  Result Value Ref  Range   WBC 5.4 4.0 - 10.5 K/uL   RBC 4.47 3.87 - 5.11 MIL/uL   Hemoglobin 13.6 12.0 - 15.0 g/dL   HCT 47.8 29.5 - 62.1 %   MCV 92.6 80.0 - 100.0 fL   MCH 30.4 26.0 - 34.0 pg   MCHC 32.9 30.0 - 36.0 g/dL   RDW 30.8 65.7 - 84.6 %   Platelets 205 150 - 400 K/uL   nRBC 0.0 0.0 - 0.2 %   Neutrophils Relative % 48 %   Neutro Abs 2.7 1.7 - 7.7 K/uL   Lymphocytes Relative 37 %   Lymphs Abs 2.0 0.7 - 4.0 K/uL   Monocytes Relative 9 %   Monocytes Absolute 0.5 0.1 - 1.0 K/uL   Eosinophils Relative 4 %   Eosinophils Absolute 0.2 0.0 -  0.5 K/uL   Basophils Relative 1 %   Basophils Absolute 0.1 0.0 - 0.1 K/uL   Immature Granulocytes 1 %   Abs Immature Granulocytes 0.03 0.00 - 0.07 K/uL    Comment: Performed at [facility], [address] [place], [place] [address]  Hepatic function panel     Status: Abnormal   Collection Time: 11/10/22  7:31 PM  Result Value Ref Range   Total Protein 6.9 6.5 - 8.1 g/dL   Albumin 3.9 3.5 - 5.0 g/dL   AST 21 15 - 41 U/L   ALT 18 0 - 44 U/L   Alkaline Phosphatase 77 38 - 126 U/L   Total Bilirubin 1.4 (H) 0.3 - 1.2 mg/dL   Bilirubin, Direct 0.3 (H) 0.0 - 0.2 mg/dL   Indirect Bilirubin 1.1 (H) 0.3 - 0.9 mg/dL    Comment: Performed at [facility], [address] [place], [place] [address]  Lipase, blood     Status: None   Collection Time: 11/10/22  7:31 PM  Result Value Ref Range   Lipase 29 11 - 51 U/L    Comment: Performed at [facility], [address] [place], [place] [address]  Comprehensive Metabolic Panel (CMET)     Status: Abnormal   Collection Time: 11/17/22  2:41 PM  Result Value Ref Range   Sodium 138 135 - 145 mmol/L   Potassium 3.3 (L) 3.5 - 5.1 mmol/L   Chloride 104 98 - 111 mmol/L   CO2 24 22 - 32 mmol/L   Glucose, Bld 106 (H) 70 - 99 mg/dL    Comment: Glucose reference range applies only to samples taken after fasting for at least 8 hours.   BUN 13 6 - 20 mg/dL   Creatinine, Ser 4.31 0.44 - 1.00  mg/dL   Calcium 8.8 (L) 8.9 - 10.3 mg/dL   Total Protein 6.9 6.5 - 8.1 g/dL   Albumin 3.9 3.5 - 5.0 g/dL   AST 22 15 - 41 U/L   ALT 18 0 - 44 U/L   Alkaline Phosphatase 73 38 - 126 U/L   Total Bilirubin 1.2 0.3 - 1.2 mg/dL   GFR, Estimated >54 >00 mL/min    Comment: (NOTE) Calculated using the CKD-EPI Creatinine Equation (2021)    Anion gap 10 5 - 15    Comment: Performed at [facility], [address] [place], [place] [address]  QuantiFERON-TB Doylene Canning Plus     Status: None   Collection Time: 11/17/22  2:41 PM  Result Value Ref Range   QuantiFERON Incubation Incubation performed.    QuantiFERON-TB Gold Plus Negative Negative    Comment: (NOTE) No response to M tuberculosis antigens detected. Infection with M tuberculosis is unlikely, but high risk individuals should be considered for additional testing (ATS/IDSA/CDC Clinical Practice Guidelines, 2017). The reference range is an Antigen minus Nil result of <0.35 IU/mL. Chemiluminescence immunoassay methodology Performed At: [facility] [address] [place], [place] [id number] Jolene Schimke MD IW:[id number]   Olena Mater Plus     Status: None   Collection Time: 11/17/22  2:41 PM  Result Value Ref Range   QuantiFERON Criteria Comment     Comment: (NOTE) QuantiFERON-TB Gold Plus is a qualitative indirect test for M tuberculosis infection (including disease) and is intended for use in conjunction with risk assessment, radiography, and other medical and diagnostic evaluations. The QuantiFERON-TB Gold Plus result is determined by subtracting the Nil value from either TB antigen (Ag) value. The Mitogen  tube serves as a control for the test.    QuantiFERON TB1 Ag Value 0.02 IU/mL   QuantiFERON TB2 Ag Value 0.02 IU/mL   QuantiFERON Nil Value 0.15 IU/mL   QuantiFERON Mitogen Value >10.00 IU/mL    Comment: (NOTE) Performed At: [facility] [address] [place], [place] [id number] Jolene Schimke MD WU:[id number]   CMP14+EGFR     Status: None   Collection Time: 12/27/22  9:21 AM  Result Value Ref Range   Glucose 94 70 - 99 mg/dL   BUN 11 6 - 24 mg/dL   Creatinine, Ser 9.56 0.57 - 1.00 mg/dL   eGFR 83 >21 HY/QMV/7.84   BUN/Creatinine Ratio 13 9 - 23   Sodium 141 134 - 144 mmol/L   Potassium 4.4 3.5 - 5.2 mmol/L   Chloride 104 96 - 106 mmol/L   CO2 25 20 - 29 mmol/L   Calcium 9.3 8.7 - 10.2 mg/dL   Total Protein 6.8 6.0 - 8.5 g/dL   Albumin 4.4 3.8 - 4.9 g/dL   Globulin, Total 2.4 1.5 - 4.5 g/dL   Bilirubin Total 0.4 0.0 - 1.2 mg/dL   Alkaline Phosphatase 110 44 - 121 IU/L   AST 24 0 - 40 IU/L   ALT 17 0 - 32 IU/L  Sed Rate (ESR)     Status: None   Collection Time: 12/27/22  9:21 AM  Result Value Ref Range   Sed Rate 7 0 - 40 mm/hr  ANA Direct w/Reflex if Positive     Status: None   Collection Time: 12/27/22  9:21 AM  Result Value Ref Range   Anti Nuclear Antibody (ANA) Negative Negative    Radiology: No results found.  No results found.  No results found.  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  Abnormal findings after chest x-ray  Clinically improved will monitor along closely  2. Gastroesophageal reflux disease without esophagitis  She does have significant reflux will continue with PPI treatment - omeprazole (PRILOSEC) 40 MG capsule; Take 1 capsule (40 mg total) by mouth daily.  Dispense: 30 capsule; Refill: 3   General Counseling: I have discussed the findings of the evaluation and examination with  Steward Drone.  I have also discussed any further diagnostic evaluation thatmay be needed or ordered today. Shanitha verbalizes understanding of the findings of todays visit. We also reviewed her medications today and discussed drug interactions and side effects including but not limited excessive drowsiness and altered mental states. We also discussed that there is always a risk not just to her but also people around her. she has been encouraged to call the office with any questions or concerns that should arise related to todays visit.  No orders of the defined types were placed in this encounter.    Time spent: 12  I have personally obtained a history, examined the patient, evaluated laboratory and imaging results, formulated the assessment and plan and placed orders.    Yevonne Pax, MD [facility] Pulmonary and Critical Care Sleep medicine

## 2023-01-12 ENCOUNTER — Ambulatory Visit (INDEPENDENT_AMBULATORY_CARE_PROVIDER_SITE_OTHER): Payer: Federal, State, Local not specified - PPO | Admitting: Physician Assistant

## 2023-01-12 ENCOUNTER — Encounter: Payer: Self-pay | Admitting: Physician Assistant

## 2023-01-12 VITALS — BP 111/72 | HR 76 | Temp 98.0°F | Ht 63.0 in | Wt 194.0 lb

## 2023-01-12 DIAGNOSIS — K8020 Calculus of gallbladder without cholecystitis without obstruction: Secondary | ICD-10-CM

## 2023-01-12 DIAGNOSIS — K8000 Calculus of gallbladder with acute cholecystitis without obstruction: Secondary | ICD-10-CM

## 2023-01-12 DIAGNOSIS — Z09 Encounter for follow-up examination after completed treatment for conditions other than malignant neoplasm: Secondary | ICD-10-CM

## 2023-01-12 DIAGNOSIS — K8050 Calculus of bile duct without cholangitis or cholecystitis without obstruction: Secondary | ICD-10-CM

## 2023-01-12 NOTE — Progress Notes (Signed)
[facility] POST-OP OFFICE VISIT  01/12/2023  HPI: Diana Black is a 57 y.o. female 12 days s/p robotic assisted laparoscopic cholecystectomy for symptomatic cholelithiasis with Dr Maurine Minister   She is doing very well Right now, she has very minimal abdominal soreness primarily at left lateral port site No fever, chills, nausea, emesis She has only had one episode of diarrhea post-operatively  Tolerating PO Incisions are well healed No other complaints   Vital signs: BP 111/72   Pulse 76   Temp 98 F (36.7 C)   Ht 5\' 3"  (1.6 m)   Wt 194 lb (88 kg)   LMP  (LMP Unknown)   SpO2 100%   BMI 34.37 kg/m    Physical Exam: Constitutional: Well appearing female, NAD Abdomen: Soft, non-tender, non-distended, no rebound/guarding Skin: Laparoscopic incisions are healing well, no erythema or drainage   Assessment/Plan: This is a 57 y.o. female 12 days s/p robotic assisted laparoscopic cholecystectomy for symptomatic cholelithiasis with Dr Maurine Minister    - She had questions about coming off her PPI again as she is worried about its link to dementia potentially. I reviewed etiology of GERD and how this is a separate process from her gallbladder. I certainly do not think it is unreasonable to trial being off her PPI if she wishes but she understands that her GERD symptoms may return and she may still require this. Okay to follow up with PCP for this   - Pain control prn; OTC medications as needed  - Reviewed wound care recommendation; nothing further  - Reviewed lifting restrictions; 4 weeks total  - Reviewed surgical pathology; CCC  - She can follow up on as needed basis; She understands to call with questions/concerns  -- Lynden Oxford, PA-C [facility] 01/12/2023, 3:54 PM M-F: 7am - 4pm

## 2023-01-12 NOTE — Patient Instructions (Signed)

## 2023-07-03 ENCOUNTER — Other Ambulatory Visit: Payer: Self-pay | Admitting: Internal Medicine

## 2023-07-03 DIAGNOSIS — K219 Gastro-esophageal reflux disease without esophagitis: Secondary | ICD-10-CM

## 2023-07-05 ENCOUNTER — Encounter: Payer: Federal, State, Local not specified - PPO | Admitting: Nurse Practitioner

## 2023-07-13 ENCOUNTER — Encounter: Payer: Self-pay | Admitting: Nurse Practitioner

## 2023-07-13 ENCOUNTER — Ambulatory Visit (INDEPENDENT_AMBULATORY_CARE_PROVIDER_SITE_OTHER): Admitting: Nurse Practitioner

## 2023-07-13 VITALS — BP 118/82 | HR 65 | Temp 98.1°F | Resp 16 | Ht 63.0 in | Wt 209.2 lb

## 2023-07-13 DIAGNOSIS — E782 Mixed hyperlipidemia: Secondary | ICD-10-CM

## 2023-07-13 DIAGNOSIS — Z0001 Encounter for general adult medical examination with abnormal findings: Secondary | ICD-10-CM | POA: Diagnosis not present

## 2023-07-13 DIAGNOSIS — K219 Gastro-esophageal reflux disease without esophagitis: Secondary | ICD-10-CM

## 2023-07-13 MED ORDER — OMEPRAZOLE 40 MG PO CPDR: 40.0000 mg | DELAYED_RELEASE_CAPSULE | Freq: Every day | ORAL | 3 refills | Status: DC

## 2023-07-13 NOTE — Progress Notes (Signed)
 [facility] [address] [place], [place] [address]  Internal MEDICINE  Office Visit Note  Patient Name: Diana Black  [id number]  [id number]  Date of Service: 07/13/2023  Chief Complaint  Patient presents with   Gastroesophageal Reflux   Hyperlipidemia   Annual Exam    HPI Diana Black presents for an annual well visit and physical exam.  Well-appearing 58 y.o. female with GERD and plantar fasciitis Routine CRC screening: due in 2034 Routine mammogram: done with obgyn DEXA scan: not due yet Pap smear: has pap scheduled with obgyn in July  Labs: due for routine labs  New or worsening pain: none  Other concerns: none     Current Medication: Outpatient Encounter Medications as of 07/13/2023  Medication Sig   latanoprost (XALATAN) 0.005 % ophthalmic solution Place 1 drop into both eyes at bedtime.   Multiple Vitamins-Minerals (MULTIVITAMIN WITH MINERALS) tablet Take 1 tablet by mouth daily. +Calcium 500 mg   [DISCONTINUED] omeprazole  (PRILOSEC) 40 MG capsule TAKE 1 CAPSULE(40 MG) BY MOUTH DAILY   omeprazole  (PRILOSEC) 40 MG capsule Take 1 capsule (40 mg total) by mouth daily.   No facility-administered encounter medications on file as of 07/13/2023.    Surgical History: Past Surgical History:  Procedure Laterality Date   CESAREAN SECTION     x2   COLONOSCOPY WITH PROPOFOL  N/A 10/08/2022   Procedure: COLONOSCOPY WITH PROPOFOL ;  Surgeon: Selena Daily, MD;  Location: [facility] ENDOSCOPY;  Service: Gastroenterology;  Laterality: N/A;    Medical History: Past Medical History:  Diagnosis Date   Benign heart murmur    Environmental allergies    GERD (gastroesophageal reflux disease)    Hyperbilirubinemia    Mixed hyperlipidemia    Obesity (BMI 30.0-34.9)    Plantar fasciitis    Symptomatic cholelithiasis    Vitamin D  deficiency     Family History: Family History  Problem Relation Age of Onset   Diabetes Mother    Bladder Cancer Mother    Memory loss  Mother    Stroke Father     Social History   Socioeconomic History   Marital status: Married    Spouse name: Not on file   Number of children: Not on file   Years of education: Not on file   Highest education level: Not on file  Occupational History   Not on file  Tobacco Use   Smoking status: Never    Passive exposure: Never   Smokeless tobacco: Never  Vaping Use   Vaping status: Never Used  Substance and Sexual Activity   Alcohol use: Yes    Comment: occ   Drug use: Never   Sexual activity: Yes  Other Topics Concern   Not on file  Social History Narrative   Not on file   Social Drivers of Health   Financial Resource Strain: Not on file  Food Insecurity: Not on file  Transportation Needs: Not on file  Physical Activity: Not on file  Stress: Not on file  Social Connections: Not on file  Intimate Partner Violence: Not on file      Review of Systems  Constitutional:  Negative for activity change, appetite change, chills, fatigue, fever and unexpected weight change.  HENT: Negative.  Negative for congestion, ear pain, rhinorrhea, sore throat and trouble swallowing.   Eyes: Negative.   Respiratory: Negative.  Negative for cough, chest tightness, shortness of breath and wheezing.   Cardiovascular: Negative.  Negative for chest pain.  Gastrointestinal: Negative.  Negative for abdominal  pain, blood in stool, constipation, diarrhea, nausea and vomiting.  Endocrine: Negative.   Genitourinary: Negative.  Negative for difficulty urinating, dysuria, frequency, hematuria and urgency.  Musculoskeletal: Negative.  Negative for arthralgias, back pain, joint swelling, myalgias and neck pain.  Skin: Negative.  Negative for rash and wound.  Allergic/Immunologic: Negative.  Negative for immunocompromised state.  Neurological: Negative.  Negative for dizziness, seizures, numbness and headaches.  Hematological: Negative.   Psychiatric/Behavioral: Negative.  Negative for behavioral  problems, self-injury and suicidal ideas. The patient is not nervous/anxious.     Vital Signs: BP 118/82   Pulse 65   Temp 98.1 F (36.7 C)   Resp 16   Ht 5\' 3"  (1.6 m)   Wt 209 lb 3.2 oz (94.9 kg)   LMP  (LMP Unknown)   SpO2 97%   BMI 37.06 kg/m    Physical Exam Vitals reviewed.  Constitutional:      General: She is not in acute distress.    Appearance: She is well-developed. She is not diaphoretic.  HENT:     Head: Normocephalic and atraumatic.     Right Ear: External ear normal.     Left Ear: External ear normal.     Nose: Nose normal.     Mouth/Throat:     Pharynx: No oropharyngeal exudate.  Eyes:     General: No scleral icterus.       Right eye: No discharge.        Left eye: No discharge.     Conjunctiva/sclera: Conjunctivae normal.     Pupils: Pupils are equal, round, and reactive to light.  Neck:     Thyroid : No thyromegaly.     Vascular: No JVD.     Trachea: No tracheal deviation.  Cardiovascular:     Rate and Rhythm: Normal rate and regular rhythm.     Heart sounds: Normal heart sounds. No murmur heard.    No friction rub. No gallop.  Pulmonary:     Effort: Pulmonary effort is normal. No respiratory distress.     Breath sounds: Normal breath sounds. No stridor. No wheezing or rales.  Chest:     Chest wall: No tenderness.  Abdominal:     General: Bowel sounds are normal. There is no distension.     Palpations: Abdomen is soft. There is no mass.     Tenderness: There is no abdominal tenderness. There is no guarding or rebound.  Musculoskeletal:        General: No tenderness or deformity. Normal range of motion.     Cervical back: Normal range of motion and neck supple.  Lymphadenopathy:     Cervical: No cervical adenopathy.  Skin:    General: Skin is warm and dry.     Coloration: Skin is not pale.     Findings: No erythema or rash.  Neurological:     Mental Status: She is alert.     Cranial Nerves: No cranial nerve deficit.     Motor: No  abnormal muscle tone.     Coordination: Coordination normal.     Deep Tendon Reflexes: Reflexes are normal and symmetric.  Psychiatric:        Behavior: Behavior normal.        Thought Content: Thought content normal.        Judgment: Judgment normal.        Assessment/Plan: 1. Encounter for routine adult health examination with abnormal findings (Primary) Age-appropriate preventive screenings and vaccinations discussed, annual physical exam completed. Routine  labs for health maintenance ordered, see below. PHM updated.   - CBC with Differential/Platelet - CMP14+EGFR - Lipid Profile  2. Gastroesophageal reflux disease without esophagitis Continue omeprazole  as prescribed. Routine labs ordered  - omeprazole  (PRILOSEC) 40 MG capsule; Take 1 capsule (40 mg total) by mouth daily.  Dispense: 90 capsule; Refill: 3 - CBC with Differential/Platelet - CMP14+EGFR - Lipid Profile  3. Mixed hyperlipidemia Routine labs ordered - CBC with Differential/Platelet - CMP14+EGFR - Lipid Profile     General Counseling: Shalisa verbalizes understanding of the findings of todays visit and agrees with plan of treatment. I have discussed any further diagnostic evaluation that may be needed or ordered today. We also reviewed her medications today. she has been encouraged to call the office with any questions or concerns that should arise related to todays visit.    Orders Placed This Encounter  Procedures   CBC with Differential/Platelet   CMP14+EGFR   Lipid Profile    Meds ordered this encounter  Medications   omeprazole  (PRILOSEC) 40 MG capsule    Sig: Take 1 capsule (40 mg total) by mouth daily.    Dispense:  90 capsule    Refill:  3    Return in about 1 year (around 07/12/2024) for CPE, Nickolaos Brallier PCP and otherwise as needed .   Total time spent:30 Minutes Time spent includes review of chart, medications, test results, and follow up plan with the patient.   [place] Controlled Substance  Database was reviewed by me.  This patient was seen by Laurence Pons, FNP-C in collaboration with Dr. Verneta Gone as a part of collaborative care agreement.  Ledger Heindl R. Bobbi Burow, MSN, FNP-C Internal medicine

## 2023-07-20 DIAGNOSIS — Z0001 Encounter for general adult medical examination with abnormal findings: Secondary | ICD-10-CM | POA: Diagnosis not present

## 2023-07-20 DIAGNOSIS — E782 Mixed hyperlipidemia: Secondary | ICD-10-CM | POA: Diagnosis not present

## 2023-07-20 DIAGNOSIS — K219 Gastro-esophageal reflux disease without esophagitis: Secondary | ICD-10-CM | POA: Diagnosis not present

## 2023-07-21 LAB — CBC WITH DIFFERENTIAL/PLATELET
Basophils Absolute: 0 10*3/uL (ref 0.0–0.2)
Basos: 1 %
EOS (ABSOLUTE): 0.1 10*3/uL (ref 0.0–0.4)
Eos: 2 %
Hematocrit: 43.7 % (ref 34.0–46.6)
Hemoglobin: 14 g/dL (ref 11.1–15.9)
Immature Grans (Abs): 0 10*3/uL (ref 0.0–0.1)
Immature Granulocytes: 0 %
Lymphocytes Absolute: 1.9 10*3/uL (ref 0.7–3.1)
Lymphs: 33 %
MCH: 29.7 pg (ref 26.6–33.0)
MCHC: 32 g/dL (ref 31.5–35.7)
MCV: 93 fL (ref 79–97)
Monocytes Absolute: 0.5 10*3/uL (ref 0.1–0.9)
Monocytes: 8 %
Neutrophils Absolute: 3.1 10*3/uL (ref 1.4–7.0)
Neutrophils: 56 %
Platelets: 284 10*3/uL (ref 150–450)
RBC: 4.72 x10E6/uL (ref 3.77–5.28)
RDW: 12.9 % (ref 11.7–15.4)
WBC: 5.6 10*3/uL (ref 3.4–10.8)

## 2023-07-21 LAB — LIPID PANEL
Chol/HDL Ratio: 3.4 ratio (ref 0.0–4.4)
Cholesterol, Total: 228 mg/dL — ABNORMAL HIGH (ref 100–199)
HDL: 68 mg/dL (ref >39)
LDL Chol Calc (NIH): 145 mg/dL — ABNORMAL HIGH (ref 0–99)
Triglycerides: 84 mg/dL (ref 0–149)
VLDL Cholesterol Cal: 15 mg/dL (ref 5–40)

## 2023-07-21 LAB — CMP14+EGFR
ALT: 16 IU/L (ref 0–32)
AST: 22 IU/L (ref 0–40)
Albumin: 4.4 g/dL (ref 3.8–4.9)
Alkaline Phosphatase: 101 IU/L (ref 44–121)
BUN/Creatinine Ratio: 16 (ref 9–23)
BUN: 12 mg/dL (ref 6–24)
Bilirubin Total: 0.6 mg/dL (ref 0.0–1.2)
CO2: 22 mmol/L (ref 20–29)
Calcium: 9.7 mg/dL (ref 8.7–10.2)
Chloride: 102 mmol/L (ref 96–106)
Creatinine, Ser: 0.76 mg/dL (ref 0.57–1.00)
Globulin, Total: 2.6 g/dL (ref 1.5–4.5)
Glucose: 83 mg/dL (ref 70–99)
Potassium: 5.1 mmol/L (ref 3.5–5.2)
Sodium: 141 mmol/L (ref 134–144)
Total Protein: 7 g/dL (ref 6.0–8.5)
eGFR: 91 mL/min/1.73

## 2023-07-30 ENCOUNTER — Encounter: Payer: Self-pay | Admitting: Nurse Practitioner

## 2023-08-08 DIAGNOSIS — H2513 Age-related nuclear cataract, bilateral: Secondary | ICD-10-CM | POA: Diagnosis not present

## 2023-08-08 DIAGNOSIS — H40053 Ocular hypertension, bilateral: Secondary | ICD-10-CM | POA: Diagnosis not present

## 2023-09-08 DIAGNOSIS — Z01419 Encounter for gynecological examination (general) (routine) without abnormal findings: Secondary | ICD-10-CM | POA: Diagnosis not present

## 2023-10-03 DIAGNOSIS — H40053 Ocular hypertension, bilateral: Secondary | ICD-10-CM | POA: Diagnosis not present

## 2023-10-03 DIAGNOSIS — H2513 Age-related nuclear cataract, bilateral: Secondary | ICD-10-CM | POA: Diagnosis not present

## 2023-10-31 DIAGNOSIS — Z1231 Encounter for screening mammogram for malignant neoplasm of breast: Secondary | ICD-10-CM | POA: Diagnosis not present

## 2023-11-09 ENCOUNTER — Encounter: Payer: Self-pay | Admitting: Nurse Practitioner

## 2023-11-09 ENCOUNTER — Ambulatory Visit: Admitting: Nurse Practitioner

## 2023-11-09 VITALS — BP 120/84 | HR 60 | Temp 97.5°F | Resp 16 | Ht 63.0 in | Wt 212.0 lb

## 2023-11-09 DIAGNOSIS — R600 Localized edema: Secondary | ICD-10-CM | POA: Diagnosis not present

## 2023-11-09 DIAGNOSIS — R609 Edema, unspecified: Secondary | ICD-10-CM | POA: Diagnosis not present

## 2023-11-09 MED ORDER — HYDROCHLOROTHIAZIDE 12.5 MG PO CAPS: 12.5000 mg | ORAL_CAPSULE | Freq: Every day | ORAL | 1 refills | Status: DC | PRN

## 2023-11-09 NOTE — Progress Notes (Signed)
 [facility] [address] [place], [place] [address]  Internal MEDICINE  Office Visit Note  Patient Name: Diana Black  [id number]  [id number]  Date of Service: 11/09/2023  Chief Complaint  Patient presents with   Acute Visit    Bilateral leg and ankle swelling      HPI Diana Black presents for an acute sick visit for bilateral leg and ankle swelling.  -- onset of lower extremity edema  Sits down a lot during the day. Swollen from knees down bilaterally, slightly painful due to tightness when bending.  Fluid retention vs lymphedema vs vascular issue       Current Medication:  Outpatient Encounter Medications as of 11/09/2023  Medication Sig   [DISCONTINUED] hydrochlorothiazide (MICROZIDE) 12.5 MG capsule Take 1 capsule (12.5 mg total) by mouth daily as needed (swelling in legs).   latanoprost (XALATAN) 0.005 % ophthalmic solution Place 1 drop into both eyes at bedtime.   Multiple Vitamins-Minerals (MULTIVITAMIN WITH MINERALS) tablet Take 1 tablet by mouth daily. +Calcium 500 mg   [DISCONTINUED] omeprazole  (PRILOSEC) 40 MG capsule Take 1 capsule (40 mg total) by mouth daily.   No facility-administered encounter medications on file as of 11/09/2023.      Medical History: Past Medical History:  Diagnosis Date   Benign heart murmur    Environmental allergies    GERD (gastroesophageal reflux disease)    Hyperbilirubinemia    Mixed hyperlipidemia    Obesity (BMI 30.0-34.9)    Plantar fasciitis    Symptomatic cholelithiasis    Vitamin D  deficiency      Vital Signs: BP 120/84   Pulse 60   Temp (!) 97.5 F (36.4 C)   Resp 16   Ht 5' 3 (1.6 m)   Wt 212 lb (96.2 kg)   LMP  (LMP Unknown)   SpO2 96%   BMI 37.55 kg/m    Review of Systems  Constitutional:  Negative for chills, fatigue and unexpected weight change.  HENT:  Negative for congestion, postnasal drip, rhinorrhea, sneezing and sore throat.   Eyes:  Negative for redness.  Respiratory:   Negative for cough, chest tightness and shortness of breath.   Cardiovascular:  Positive for leg swelling. Negative for chest pain and palpitations.  Gastrointestinal:  Negative for abdominal pain, constipation, diarrhea, nausea and vomiting.  Genitourinary:  Negative for dysuria and frequency.  Musculoskeletal:  Positive for myalgias. Negative for arthralgias, back pain, joint swelling and neck pain.  Skin:  Negative for rash.  Neurological: Negative.  Negative for tremors and numbness.  Hematological:  Negative for adenopathy. Does not bruise/bleed easily.  Psychiatric/Behavioral:  Negative for behavioral problems (Depression), self-injury, sleep disturbance and suicidal ideas. The patient is not nervous/anxious.     Physical Exam Vitals reviewed.  Constitutional:      General: She is not in acute distress.    Appearance: Normal appearance. She is obese. She is not ill-appearing.  HENT:     Head: Normocephalic and atraumatic.  Eyes:     Pupils: Pupils are equal, round, and reactive to light.  Cardiovascular:     Rate and Rhythm: Normal rate and regular rhythm.  Pulmonary:     Effort: Pulmonary effort is normal. No respiratory distress.  Musculoskeletal:     Right lower leg: 2+ Edema present.     Left lower leg: 2+ Edema present.  Skin:    General: Skin is warm and dry.     Capillary Refill: Capillary refill takes less than 2 seconds.  Neurological:     Mental Status: She is alert and oriented to person, place, and time.  Psychiatric:        Mood and Affect: Mood normal.        Behavior: Behavior normal.       Assessment/Plan: 1. Dependent edema (Primary) Hydrochlorothiazide prescribed, follow up in 4 weeks   2. Fluid retention in legs Hydrochlorothiazide prescribed, follow up in 4 weeks.    General Counseling: Diana Black understanding of the findings of todays visit and agrees with plan of treatment. I have discussed any further diagnostic evaluation that may  be needed or ordered today. We also reviewed her medications today. she has been encouraged to call the office with any questions or concerns that should arise related to todays visit.    Counseling:    No orders of the defined types were placed in this encounter.   Meds ordered this encounter  Medications   DISCONTD: hydrochlorothiazide (MICROZIDE) 12.5 MG capsule    Sig: Take 1 capsule (12.5 mg total) by mouth daily as needed (swelling in legs).    Dispense:  30 capsule    Refill:  1    Fill new script today.    Return in about 4 weeks (around 12/07/2023) for F/U, eval new med, Dawsen Krieger PCP wants flu shot next time. .  [place] Controlled Substance Database was reviewed by me for overdose risk score (ORS)  Time spent:30 Minutes Time spent with patient included reviewing progress notes, labs, imaging studies, and discussing plan for follow up.   This patient was seen by Mardy Maxin, FNP-C in collaboration with Dr. Sigrid Bathe as a part of collaborative care agreement.  Torian Quintero R. Maxin, MSN, FNP-C Internal Medicine

## 2023-11-19 ENCOUNTER — Other Ambulatory Visit: Payer: Self-pay | Admitting: Nurse Practitioner

## 2023-11-19 DIAGNOSIS — K219 Gastro-esophageal reflux disease without esophagitis: Secondary | ICD-10-CM

## 2023-12-06 ENCOUNTER — Encounter: Payer: Self-pay | Admitting: Nurse Practitioner

## 2023-12-06 ENCOUNTER — Ambulatory Visit: Admitting: Nurse Practitioner

## 2023-12-06 VITALS — BP 128/82 | HR 68 | Temp 97.2°F | Resp 16 | Ht 63.0 in | Wt 210.6 lb

## 2023-12-06 DIAGNOSIS — R600 Localized edema: Secondary | ICD-10-CM | POA: Diagnosis not present

## 2023-12-06 DIAGNOSIS — R609 Edema, unspecified: Secondary | ICD-10-CM

## 2023-12-06 DIAGNOSIS — M79604 Pain in right leg: Secondary | ICD-10-CM | POA: Diagnosis not present

## 2023-12-06 DIAGNOSIS — Z23 Encounter for immunization: Secondary | ICD-10-CM

## 2023-12-06 DIAGNOSIS — M79605 Pain in left leg: Secondary | ICD-10-CM | POA: Diagnosis not present

## 2023-12-06 MED ORDER — HYDROCHLOROTHIAZIDE 12.5 MG PO CAPS: 12.5000 mg | ORAL_CAPSULE | Freq: Every day | ORAL | 3 refills | Status: AC | PRN

## 2023-12-06 NOTE — Progress Notes (Signed)
 [facility] [address] [place], [place] [address]  Internal MEDICINE  Office Visit Note  Patient Name: Diana Black  [id number]  [id number]  Date of Service: 12/06/2023  Chief Complaint  Patient presents with   Gastroesophageal Reflux   Hyperlipidemia   Follow-up    HPI Sybol presents for a follow-up visit for lower extremity edema, leg pain and vaccines.  Lower extremity edema -- minimal improvement with hydrochlorothiazide   Bilateral leg pain -- does have pain with the swelling and tightness in the legs  Requesting flu vaccine    Current Medication: Outpatient Encounter Medications as of 12/06/2023  Medication Sig   hydrochlorothiazide  (MICROZIDE ) 12.5 MG capsule Take 1 capsule (12.5 mg total) by mouth daily as needed (swelling in legs).   latanoprost (XALATAN) 0.005 % ophthalmic solution Place 1 drop into both eyes at bedtime.   Multiple Vitamins-Minerals (MULTIVITAMIN WITH MINERALS) tablet Take 1 tablet by mouth daily. +Calcium 500 mg   omeprazole  (PRILOSEC) 40 MG capsule TAKE 1 CAPSULE(40 MG) BY MOUTH DAILY   [DISCONTINUED] hydrochlorothiazide  (MICROZIDE ) 12.5 MG capsule Take 1 capsule (12.5 mg total) by mouth daily as needed (swelling in legs).   No facility-administered encounter medications on file as of 12/06/2023.    Surgical History: Past Surgical History:  Procedure Laterality Date   CESAREAN SECTION     x2   COLONOSCOPY WITH PROPOFOL  N/A 10/08/2022   Procedure: COLONOSCOPY WITH PROPOFOL ;  Surgeon: Unk Corinn Skiff, MD;  Location: [facility] ENDOSCOPY;  Service: Gastroenterology;  Laterality: N/A;    Medical History: Past Medical History:  Diagnosis Date   Benign heart murmur    Environmental allergies    GERD (gastroesophageal reflux disease)    Hyperbilirubinemia    Mixed hyperlipidemia    Obesity (BMI 30.0-34.9)    Plantar fasciitis    Symptomatic cholelithiasis    Vitamin D  deficiency     Family History: Family History   Problem Relation Age of Onset   Diabetes Mother    Bladder Cancer Mother    Memory loss Mother    Stroke Father     Social History   Socioeconomic History   Marital status: Married    Spouse name: Not on file   Number of children: Not on file   Years of education: Not on file   Highest education level: Not on file  Occupational History   Not on file  Tobacco Use   Smoking status: Never    Passive exposure: Never   Smokeless tobacco: Never  Vaping Use   Vaping status: Never Used  Substance and Sexual Activity   Alcohol use: Yes    Comment: occ   Drug use: Never   Sexual activity: Yes  Other Topics Concern   Not on file  Social History Narrative   Not on file   Social Drivers of Health   Financial Resource Strain: Not on file  Food Insecurity: Not on file  Transportation Needs: Not on file  Physical Activity: Not on file  Stress: Not on file  Social Connections: Not on file  Intimate Partner Violence: Not on file      Review of Systems  Constitutional:  Negative for chills, fatigue and unexpected weight change.  HENT:  Negative for congestion, postnasal drip, rhinorrhea, sneezing and sore throat.   Eyes:  Negative for redness.  Respiratory:  Negative for cough, chest tightness and shortness of breath.   Cardiovascular:  Positive for leg swelling. Negative for chest pain and palpitations.  Gastrointestinal:  Negative for abdominal pain, constipation, diarrhea, nausea and vomiting.  Genitourinary:  Negative for dysuria and frequency.  Musculoskeletal:  Positive for myalgias. Negative for arthralgias, back pain, joint swelling and neck pain.  Skin:  Negative for rash.  Neurological: Negative.  Negative for tremors and numbness.  Hematological:  Negative for adenopathy. Does not bruise/bleed easily.  Psychiatric/Behavioral:  Negative for behavioral problems (Depression), self-injury, sleep disturbance and suicidal ideas. The patient is not nervous/anxious.      Vital Signs: BP 128/82   Pulse 68   Temp (!) 97.2 F (36.2 C)   Resp 16   Ht 5' 3 (1.6 m)   Wt 210 lb 9.6 oz (95.5 kg)   LMP  (LMP Unknown)   SpO2 98%   BMI 37.31 kg/m    Physical Exam Vitals reviewed.  Constitutional:      General: She is not in acute distress.    Appearance: Normal appearance. She is obese. She is not ill-appearing.  HENT:     Head: Normocephalic and atraumatic.  Eyes:     Pupils: Pupils are equal, round, and reactive to light.  Cardiovascular:     Rate and Rhythm: Normal rate and regular rhythm.  Pulmonary:     Effort: Pulmonary effort is normal. No respiratory distress.  Musculoskeletal:     Right lower leg: 2+ Edema present.     Left lower leg: 2+ Edema present.  Skin:    General: Skin is warm and dry.     Capillary Refill: Capillary refill takes less than 2 seconds.  Neurological:     Mental Status: She is alert and oriented to person, place, and time.  Psychiatric:        Mood and Affect: Mood normal.        Behavior: Behavior normal.        Assessment/Plan: 1. Bilateral lower extremity edema (Primary) Continue hydrochlorothiazide  as prescribed. Referred to vascular surgery - Ambulatory referral to Vascular Surgery - hydrochlorothiazide  (MICROZIDE ) 12.5 MG capsule; Take 1 capsule (12.5 mg total) by mouth daily as needed (swelling in legs).  Dispense: 30 capsule; Refill: 3  2. Bilateral leg pain Continue hydrochlorothiazide  as prescribed. Referred to vascular surgery - Ambulatory referral to Vascular Surgery - hydrochlorothiazide  (MICROZIDE ) 12.5 MG capsule; Take 1 capsule (12.5 mg total) by mouth daily as needed (swelling in legs).  Dispense: 30 capsule; Refill: 3  3. Flu vaccine need Flu vaccine administered in office today  - Influenza, MDCK, trivalent, PF(Flucelvax egg-free)   General Counseling: Elycia verbalizes understanding of the findings of todays visit and agrees with plan of treatment. I have discussed any further  diagnostic evaluation that may be needed or ordered today. We also reviewed her medications today. she has been encouraged to call the office with any questions or concerns that should arise related to todays visit.    Orders Placed This Encounter  Procedures   Influenza, MDCK, trivalent, PF(Flucelvax egg-free)   Ambulatory referral to Vascular Surgery    Meds ordered this encounter  Medications   hydrochlorothiazide  (MICROZIDE ) 12.5 MG capsule    Sig: Take 1 capsule (12.5 mg total) by mouth daily as needed (swelling in legs).    Dispense:  30 capsule    Refill:  3    Fill new script today.    Return if symptoms worsen or fail to improve, for keep next  scheduled follow up and referred to vascular surgery.   Total time spent:30 Minutes Time spent includes review of chart, medications, test results,  and follow up plan with the patient.    Controlled Substance Database was reviewed by me.  This patient was seen by Mardy Maxin, FNP-C in collaboration with Dr. Sigrid Bathe as a part of collaborative care agreement.   Yocheved Depner R. Maxin, MSN, FNP-C Internal medicine

## 2023-12-09 ENCOUNTER — Encounter: Payer: Self-pay | Admitting: Nurse Practitioner

## 2023-12-15 ENCOUNTER — Encounter (INDEPENDENT_AMBULATORY_CARE_PROVIDER_SITE_OTHER): Payer: Self-pay | Admitting: Vascular Surgery

## 2023-12-15 ENCOUNTER — Ambulatory Visit (INDEPENDENT_AMBULATORY_CARE_PROVIDER_SITE_OTHER): Admitting: Vascular Surgery

## 2023-12-15 VITALS — BP 124/78 | HR 74 | Resp 18 | Ht 62.0 in | Wt 210.0 lb

## 2023-12-15 DIAGNOSIS — K219 Gastro-esophageal reflux disease without esophagitis: Secondary | ICD-10-CM

## 2023-12-15 DIAGNOSIS — I890 Lymphedema, not elsewhere classified: Secondary | ICD-10-CM | POA: Diagnosis not present

## 2023-12-27 ENCOUNTER — Encounter (INDEPENDENT_AMBULATORY_CARE_PROVIDER_SITE_OTHER): Payer: Self-pay | Admitting: Vascular Surgery

## 2023-12-27 NOTE — Progress Notes (Signed)
 Subjective:    Patient ID: Diana Black, female    DOB: May 08, 1965, 58 y.o.   MRN: [id number] Chief Complaint  Patient presents with   Establish Care    New patient consult Bilateral LE edema, leg pain ref. aberbathy    Diana Black is a 58 yo female who presents to clinic today in consult for bilateral lower extremity edema and swelling.  Patient endorses she has had swelling on and off to her lower extremities for the past year.  She denies any pain with the swelling, states that the swelling is just very uncomfortable and makes her legs feel heavy.  She endorses that over the last 2 to 3 months the swelling has become more consistent.  Prior she was able to go to bed at night and lie flat and wake up in the morning with less swelling and today she feels that the swelling is not decreasing like it once did.  Patient endorses she is not using any type of conventional therapy at this time.  Patient also endorses she is not currently on any weight loss program.    Review of Systems  Constitutional: Negative.   Cardiovascular:  Positive for leg swelling.  Musculoskeletal:  Positive for myalgias.  Skin:  Positive for color change.  All other systems reviewed and are negative.      Objective:   Physical Exam Constitutional:      Appearance: Normal appearance. She is obese.  HENT:     Head: Normocephalic.  Eyes:     Pupils: Pupils are equal, round, and reactive to light.  Cardiovascular:     Rate and Rhythm: Normal rate and regular rhythm.     Pulses: Normal pulses.     Heart sounds: Normal heart sounds.     Comments: Positive murmur heard on auscultation. Pulmonary:     Effort: Pulmonary effort is normal.     Breath sounds: Normal breath sounds.  Abdominal:     General: Bowel sounds are normal.     Palpations: Abdomen is soft.  Musculoskeletal:        General: Swelling and tenderness present.     Right lower leg: Edema present.     Left lower leg: Edema present.   Skin:    General: Skin is warm and dry.     Capillary Refill: Capillary refill takes 2 to 3 seconds.  Neurological:     General: No focal deficit present.     Mental Status: She is alert and oriented to person, place, and time. Mental status is at baseline.  Psychiatric:        Mood and Affect: Mood normal.        Behavior: Behavior normal.        Thought Content: Thought content normal.        Judgment: Judgment normal.     BP 124/78 (BP Location: Left Arm, Patient Position: Sitting, Cuff Size: Normal)   Pulse 74   Resp 18   Ht 5' 2 (1.575 m)   Wt 210 lb (95.3 kg)   LMP  (LMP Unknown)   BMI 38.41 kg/m   Past Medical History:  Diagnosis Date   Benign heart murmur    Environmental allergies    GERD (gastroesophageal reflux disease)    Hyperbilirubinemia    Mixed hyperlipidemia    Obesity (BMI 30.0-34.9)    Plantar fasciitis    Symptomatic cholelithiasis    Vitamin D  deficiency     Social History  Socioeconomic History   Marital status: Married    Spouse name: Not on file   Number of children: Not on file   Years of education: Not on file   Highest education level: Not on file  Occupational History   Not on file  Tobacco Use   Smoking status: Never    Passive exposure: Never   Smokeless tobacco: Never  Vaping Use   Vaping status: Never Used  Substance and Sexual Activity   Alcohol use: Yes    Comment: occ   Drug use: Never   Sexual activity: Yes  Other Topics Concern   Not on file  Social History Narrative   Not on file   Social Drivers of Health   Financial Resource Strain: Not on file  Food Insecurity: Not on file  Transportation Needs: Not on file  Physical Activity: Not on file  Stress: Not on file  Social Connections: Not on file  Intimate Partner Violence: Not on file    Past Surgical History:  Procedure Laterality Date   CESAREAN SECTION     x2   COLONOSCOPY WITH PROPOFOL  N/A 10/08/2022   Procedure: COLONOSCOPY WITH PROPOFOL ;   Surgeon: Unk Corinn Skiff, MD;  Location: [facility] ENDOSCOPY;  Service: Gastroenterology;  Laterality: N/A;    Family History  Problem Relation Age of Onset   Diabetes Mother    Bladder Cancer Mother    Memory loss Mother    Stroke Father     No Known Allergies     Latest Ref Rng & Units 07/20/2023    4:33 PM 11/10/2022    7:31 PM 07/02/2022    8:18 AM  CBC  WBC 3.4 - 10.8 x10E3/uL 5.6  5.4  7.3   Hemoglobin 11.1 - 15.9 g/dL 85.9  86.3  85.7   Hematocrit 34.0 - 46.6 % 43.7  41.4  43.4   Platelets 150 - 450 x10E3/uL 284  205  247       CMP     Component Value Date/Time   NA 141 07/20/2023 1633   K 5.1 07/20/2023 1633   CL 102 07/20/2023 1633   CO2 22 07/20/2023 1633   GLUCOSE 83 07/20/2023 1633   GLUCOSE 106 (H) 11/17/2022 1441   BUN 12 07/20/2023 1633   CREATININE 0.76 07/20/2023 1633   CALCIUM 9.7 07/20/2023 1633   PROT 7.0 07/20/2023 1633   ALBUMIN 4.4 07/20/2023 1633   AST 22 07/20/2023 1633   ALT 16 07/20/2023 1633   ALKPHOS 101 07/20/2023 1633   BILITOT 0.6 07/20/2023 1633   EGFR 91 07/20/2023 1633   GFRNONAA >60 11/17/2022 1441     No results found.     Assessment & Plan:   1. Lymphedema (Primary) Recommend:  I have had a long discussion with the patient regarding swelling and why it  causes symptoms.  Patient will begin wearing graduated compression on a daily basis a prescription was given. The patient will  wear the stockings first thing in the morning and removing them in the evening. The patient is instructed specifically not to sleep in the stockings.   In addition, behavioral modification will be initiated.  This will include frequent elevation, use of over the counter pain medications and exercise such as walking.  Consideration for a lymph pump will also be made based upon the effectiveness of conservative therapy.  This would help to improve the edema control and prevent sequela such as ulcers and infections   Patient should undergo duplex  ultrasound  of the venous system to ensure that DVT or reflux is not present.  The patient will follow-up with me after the ultrasound.   2. Gastroesophageal reflux disease without esophagitis Continue PPI as already ordered, this medication has been reviewed and there are no changes at this time.  Avoidence of caffeine and alcohol  Moderate elevation of the head of the bed   3. Morbid Obesity I had a 10 to 15-minute conversation concerning the patient's weight.  We discussed how her obesity affects and plays a major part in lymphedema to her lower extremities.  Vascular surgery recommends patient follow-up with primary care provider to possibly start GPO 1 to help assist with weight loss which will ultimately help reduce the swelling to her lower extremities.   Current Outpatient Medications on File Prior to Visit  Medication Sig Dispense Refill   hydrochlorothiazide (MICROZIDE) 12.5 MG capsule Take 1 capsule (12.5 mg total) by mouth daily as needed (swelling in legs). 30 capsule 3   latanoprost (XALATAN) 0.005 % ophthalmic solution Place 1 drop into both eyes at bedtime.     Multiple Vitamins-Minerals (MULTIVITAMIN WITH MINERALS) tablet Take 1 tablet by mouth daily. +Calcium 500 mg     omeprazole  (PRILOSEC) 40 MG capsule TAKE 1 CAPSULE(40 MG) BY MOUTH DAILY 90 capsule 3   No current facility-administered medications on file prior to visit.    There are no Patient Instructions on file for this visit. No follow-ups on file.   Gwendlyn JONELLE Shank, NP

## 2023-12-30 ENCOUNTER — Encounter: Payer: Self-pay | Admitting: Nurse Practitioner

## 2023-12-30 DIAGNOSIS — R600 Localized edema: Secondary | ICD-10-CM | POA: Insufficient documentation

## 2024-03-16 ENCOUNTER — Ambulatory Visit (INDEPENDENT_AMBULATORY_CARE_PROVIDER_SITE_OTHER): Admitting: Vascular Surgery

## 2024-03-16 ENCOUNTER — Encounter (INDEPENDENT_AMBULATORY_CARE_PROVIDER_SITE_OTHER)

## 2024-04-04 ENCOUNTER — Encounter (INDEPENDENT_AMBULATORY_CARE_PROVIDER_SITE_OTHER)

## 2024-04-04 ENCOUNTER — Ambulatory Visit (INDEPENDENT_AMBULATORY_CARE_PROVIDER_SITE_OTHER): Admitting: Nurse Practitioner

## 2024-07-16 ENCOUNTER — Encounter: Admitting: Nurse Practitioner
